# Patient Record
Sex: Female | Born: 1945
Health system: Southern US, Community
[De-identification: ages and names within clinical notes are randomized; demographics above are authoritative.]

## PROBLEM LIST (undated history)

## (undated) DIAGNOSIS — C50919 Malignant neoplasm of unspecified site of unspecified female breast: Secondary | ICD-10-CM

## (undated) DIAGNOSIS — I10 Essential (primary) hypertension: Secondary | ICD-10-CM

## (undated) DIAGNOSIS — C801 Malignant (primary) neoplasm, unspecified: Secondary | ICD-10-CM

---

## 1997-09-18 HISTORY — PX: MASTECTOMY: SHX3

## 1997-12-30 ENCOUNTER — Ambulatory Visit (HOSPITAL_COMMUNITY): Admission: RE | Admit: 1997-12-30 | Discharge: 1997-12-30 | Payer: Self-pay | Admitting: Hematology and Oncology

## 1998-01-04 ENCOUNTER — Ambulatory Visit (HOSPITAL_COMMUNITY): Admission: RE | Admit: 1998-01-04 | Discharge: 1998-01-04 | Payer: Self-pay | Admitting: Hematology and Oncology

## 1998-01-07 ENCOUNTER — Ambulatory Visit (HOSPITAL_BASED_OUTPATIENT_CLINIC_OR_DEPARTMENT_OTHER): Admission: RE | Admit: 1998-01-07 | Discharge: 1998-01-07 | Payer: Self-pay | Admitting: Surgery

## 1998-03-29 ENCOUNTER — Encounter: Admission: RE | Admit: 1998-03-29 | Discharge: 1998-06-27 | Payer: Self-pay | Admitting: *Deleted

## 1998-06-30 ENCOUNTER — Encounter: Admission: RE | Admit: 1998-06-30 | Discharge: 1998-09-28 | Payer: Self-pay | Admitting: Hematology and Oncology

## 1998-12-09 ENCOUNTER — Ambulatory Visit (HOSPITAL_BASED_OUTPATIENT_CLINIC_OR_DEPARTMENT_OTHER): Admission: RE | Admit: 1998-12-09 | Discharge: 1998-12-09 | Payer: Self-pay | Admitting: Plastic Surgery

## 1999-01-25 ENCOUNTER — Ambulatory Visit (HOSPITAL_BASED_OUTPATIENT_CLINIC_OR_DEPARTMENT_OTHER): Admission: RE | Admit: 1999-01-25 | Discharge: 1999-01-25 | Payer: Self-pay | Admitting: Plastic Surgery

## 1999-05-12 ENCOUNTER — Other Ambulatory Visit: Admission: RE | Admit: 1999-05-12 | Discharge: 1999-05-12 | Payer: Self-pay | Admitting: Hematology and Oncology

## 1999-05-26 ENCOUNTER — Encounter: Admission: RE | Admit: 1999-05-26 | Discharge: 1999-06-23 | Payer: Self-pay | Admitting: Hematology and Oncology

## 1999-07-29 ENCOUNTER — Encounter: Payer: Self-pay | Admitting: Hematology and Oncology

## 1999-07-29 ENCOUNTER — Encounter: Admission: RE | Admit: 1999-07-29 | Discharge: 1999-07-29 | Payer: Self-pay | Admitting: Hematology and Oncology

## 1999-11-02 ENCOUNTER — Ambulatory Visit (HOSPITAL_COMMUNITY): Admission: RE | Admit: 1999-11-02 | Discharge: 1999-11-02 | Payer: Self-pay | Admitting: Hematology & Oncology

## 1999-11-02 ENCOUNTER — Encounter: Payer: Self-pay | Admitting: Hematology & Oncology

## 2000-01-18 ENCOUNTER — Encounter: Payer: Self-pay | Admitting: Hematology & Oncology

## 2000-01-18 ENCOUNTER — Encounter: Admission: RE | Admit: 2000-01-18 | Discharge: 2000-01-18 | Payer: Self-pay | Admitting: Hematology & Oncology

## 2000-02-16 ENCOUNTER — Ambulatory Visit (HOSPITAL_COMMUNITY): Admission: RE | Admit: 2000-02-16 | Discharge: 2000-02-16 | Payer: Self-pay | Admitting: Hematology & Oncology

## 2000-02-16 ENCOUNTER — Encounter: Payer: Self-pay | Admitting: Hematology & Oncology

## 2000-02-22 ENCOUNTER — Ambulatory Visit (HOSPITAL_BASED_OUTPATIENT_CLINIC_OR_DEPARTMENT_OTHER): Admission: RE | Admit: 2000-02-22 | Discharge: 2000-02-22 | Payer: Self-pay | Admitting: Surgery

## 2000-07-27 ENCOUNTER — Encounter: Admission: RE | Admit: 2000-07-27 | Discharge: 2000-07-27 | Payer: Self-pay | Admitting: Gynecology

## 2000-07-27 ENCOUNTER — Encounter: Payer: Self-pay | Admitting: Gynecology

## 2000-08-29 ENCOUNTER — Other Ambulatory Visit: Admission: RE | Admit: 2000-08-29 | Discharge: 2000-08-29 | Payer: Self-pay | Admitting: Gynecology

## 2001-01-31 ENCOUNTER — Encounter: Admission: RE | Admit: 2001-01-31 | Discharge: 2001-01-31 | Payer: Self-pay | Admitting: Hematology & Oncology

## 2001-01-31 ENCOUNTER — Encounter: Payer: Self-pay | Admitting: Hematology & Oncology

## 2002-02-05 ENCOUNTER — Encounter: Admission: RE | Admit: 2002-02-05 | Discharge: 2002-02-05 | Payer: Self-pay | Admitting: *Deleted

## 2002-02-05 ENCOUNTER — Encounter: Payer: Self-pay | Admitting: *Deleted

## 2002-09-08 ENCOUNTER — Encounter: Admission: RE | Admit: 2002-09-08 | Discharge: 2002-09-08 | Payer: Self-pay | Admitting: Family Medicine

## 2002-09-08 ENCOUNTER — Encounter: Payer: Self-pay | Admitting: Family Medicine

## 2003-02-19 ENCOUNTER — Encounter: Payer: Self-pay | Admitting: Family Medicine

## 2003-02-19 ENCOUNTER — Encounter: Admission: RE | Admit: 2003-02-19 | Discharge: 2003-02-19 | Payer: Self-pay | Admitting: Family Medicine

## 2003-11-30 ENCOUNTER — Encounter: Admission: RE | Admit: 2003-11-30 | Discharge: 2003-11-30 | Payer: Self-pay | Admitting: Family Medicine

## 2004-02-24 ENCOUNTER — Encounter: Admission: RE | Admit: 2004-02-24 | Discharge: 2004-02-24 | Payer: Self-pay | Admitting: Hematology & Oncology

## 2004-10-13 ENCOUNTER — Ambulatory Visit: Payer: Self-pay | Admitting: Hematology & Oncology

## 2005-02-28 ENCOUNTER — Encounter: Admission: RE | Admit: 2005-02-28 | Discharge: 2005-02-28 | Payer: Self-pay | Admitting: Hematology & Oncology

## 2005-09-25 ENCOUNTER — Other Ambulatory Visit: Admission: RE | Admit: 2005-09-25 | Discharge: 2005-09-25 | Payer: Self-pay | Admitting: Obstetrics and Gynecology

## 2005-10-09 ENCOUNTER — Ambulatory Visit: Payer: Self-pay | Admitting: Hematology & Oncology

## 2006-03-01 ENCOUNTER — Encounter: Admission: RE | Admit: 2006-03-01 | Discharge: 2006-03-01 | Payer: Self-pay | Admitting: Hematology & Oncology

## 2006-10-03 ENCOUNTER — Ambulatory Visit: Payer: Self-pay | Admitting: Hematology & Oncology

## 2006-10-08 LAB — CBC WITH DIFFERENTIAL/PLATELET
BASO%: 1 % (ref 0.0–2.0)
Basophils Absolute: 0 10*3/uL (ref 0.0–0.1)
Eosinophils Absolute: 0 10*3/uL (ref 0.0–0.5)
HGB: 13.6 g/dL (ref 11.6–15.9)
MONO#: 0.2 10*3/uL (ref 0.1–0.9)
NEUT%: 43.7 % (ref 39.6–76.8)
WBC: 4.2 10*3/uL (ref 3.9–10.0)
lymph#: 2.1 10*3/uL (ref 0.9–3.3)

## 2006-10-08 LAB — COMPREHENSIVE METABOLIC PANEL
AST: 20 U/L (ref 0–37)
Albumin: 4.7 g/dL (ref 3.5–5.2)
Alkaline Phosphatase: 69 U/L (ref 39–117)
BUN: 19 mg/dL (ref 6–23)
CO2: 24 mEq/L (ref 19–32)
Creatinine, Ser: 0.75 mg/dL (ref 0.40–1.20)
Glucose, Bld: 90 mg/dL (ref 70–99)
Sodium: 140 mEq/L (ref 135–145)
Total Bilirubin: 0.5 mg/dL (ref 0.3–1.2)
Total Protein: 7.3 g/dL (ref 6.0–8.3)

## 2007-03-04 ENCOUNTER — Encounter: Admission: RE | Admit: 2007-03-04 | Discharge: 2007-03-04 | Payer: Self-pay | Admitting: Hematology & Oncology

## 2007-10-02 ENCOUNTER — Ambulatory Visit: Payer: Self-pay | Admitting: Hematology & Oncology

## 2007-10-07 LAB — LACTATE DEHYDROGENASE: LDH: 138 U/L (ref 94–250)

## 2007-10-07 LAB — COMPREHENSIVE METABOLIC PANEL
ALT: 16 U/L (ref 0–35)
Albumin: 4.6 g/dL (ref 3.5–5.2)
Glucose, Bld: 95 mg/dL (ref 70–99)
Potassium: 3.8 mEq/L (ref 3.5–5.3)
Sodium: 140 mEq/L (ref 135–145)
Total Protein: 7.3 g/dL (ref 6.0–8.3)

## 2007-10-07 LAB — CBC WITH DIFFERENTIAL/PLATELET
BASO%: 0.2 % (ref 0.0–2.0)
HGB: 13.9 g/dL (ref 11.6–15.9)
LYMPH%: 31 % (ref 14.0–48.0)
MCH: 33.5 pg (ref 26.0–34.0)
MCHC: 35.3 g/dL (ref 32.0–36.0)
MONO#: 0.2 10*3/uL (ref 0.1–0.9)
NEUT#: 3.9 10*3/uL (ref 1.5–6.5)

## 2008-03-05 ENCOUNTER — Encounter: Admission: RE | Admit: 2008-03-05 | Discharge: 2008-03-05 | Payer: Self-pay | Admitting: Hematology & Oncology

## 2008-10-09 ENCOUNTER — Ambulatory Visit: Payer: Self-pay | Admitting: Hematology & Oncology

## 2008-10-12 LAB — CBC WITH DIFFERENTIAL (CANCER CENTER ONLY)
BASO#: 0 10*3/uL (ref 0.0–0.2)
BASO%: 0.4 % (ref 0.0–2.0)
HCT: 37.7 % (ref 34.8–46.6)
MCH: 33.3 pg (ref 26.0–34.0)
MONO#: 0.2 10*3/uL (ref 0.1–0.9)
MONO%: 3.6 % (ref 0.0–13.0)
NEUT#: 2.5 10*3/uL (ref 1.5–6.5)
NEUT%: 54.4 % (ref 39.6–80.0)
Platelets: 211 10*3/uL (ref 145–400)
RBC: 3.98 10*6/uL (ref 3.70–5.32)
WBC: 4.5 10*3/uL (ref 3.9–10.0)

## 2008-10-12 LAB — COMPREHENSIVE METABOLIC PANEL
ALT: 23 U/L (ref 0–35)
Albumin: 4.5 g/dL (ref 3.5–5.2)
CO2: 22 mEq/L (ref 19–32)
Calcium: 9.5 mg/dL (ref 8.4–10.5)
Potassium: 3.5 mEq/L (ref 3.5–5.3)
Sodium: 141 mEq/L (ref 135–145)
Total Protein: 6.8 g/dL (ref 6.0–8.3)

## 2009-03-08 ENCOUNTER — Encounter: Admission: RE | Admit: 2009-03-08 | Discharge: 2009-03-08 | Payer: Self-pay | Admitting: Hematology & Oncology

## 2009-05-07 ENCOUNTER — Encounter (INDEPENDENT_AMBULATORY_CARE_PROVIDER_SITE_OTHER): Payer: Self-pay | Admitting: *Deleted

## 2009-09-30 ENCOUNTER — Encounter (INDEPENDENT_AMBULATORY_CARE_PROVIDER_SITE_OTHER): Payer: Self-pay | Admitting: *Deleted

## 2009-09-30 ENCOUNTER — Telehealth: Payer: Self-pay | Admitting: Internal Medicine

## 2009-10-08 ENCOUNTER — Ambulatory Visit: Payer: Self-pay | Admitting: Hematology & Oncology

## 2009-10-14 LAB — CBC WITH DIFFERENTIAL (CANCER CENTER ONLY)
BASO%: 0.5 % (ref 0.0–2.0)
EOS%: 1 % (ref 0.0–7.0)
Eosinophils Absolute: 0.1 10*3/uL (ref 0.0–0.5)
HCT: 40.4 % (ref 34.8–46.6)
HGB: 13.9 g/dL (ref 11.6–15.9)
LYMPH%: 42.7 % (ref 14.0–48.0)
MCH: 32.7 pg (ref 26.0–34.0)
MCV: 95 fL (ref 81–101)
NEUT%: 52.7 % (ref 39.6–80.0)
RBC: 4.24 10*6/uL (ref 3.70–5.32)
RDW: 10.4 % — ABNORMAL LOW (ref 10.5–14.6)

## 2009-10-14 LAB — COMPREHENSIVE METABOLIC PANEL
ALT: 19 U/L (ref 0–35)
BUN: 20 mg/dL (ref 6–23)
Calcium: 9.6 mg/dL (ref 8.4–10.5)
Creatinine, Ser: 0.77 mg/dL (ref 0.40–1.20)
Glucose, Bld: 122 mg/dL — ABNORMAL HIGH (ref 70–99)
Potassium: 3.5 mEq/L (ref 3.5–5.3)
Sodium: 140 mEq/L (ref 135–145)
Total Protein: 7.2 g/dL (ref 6.0–8.3)

## 2009-10-28 ENCOUNTER — Ambulatory Visit: Payer: Self-pay | Admitting: Diagnostic Radiology

## 2009-10-28 ENCOUNTER — Ambulatory Visit (HOSPITAL_BASED_OUTPATIENT_CLINIC_OR_DEPARTMENT_OTHER): Admission: RE | Admit: 2009-10-28 | Discharge: 2009-10-28 | Payer: Self-pay | Admitting: Hematology & Oncology

## 2010-03-10 ENCOUNTER — Encounter: Admission: RE | Admit: 2010-03-10 | Discharge: 2010-03-10 | Payer: Self-pay | Admitting: Hematology & Oncology

## 2010-09-27 ENCOUNTER — Encounter (INDEPENDENT_AMBULATORY_CARE_PROVIDER_SITE_OTHER): Payer: Self-pay | Admitting: *Deleted

## 2010-10-11 ENCOUNTER — Ambulatory Visit: Payer: Self-pay | Admitting: Hematology & Oncology

## 2010-10-13 LAB — CBC WITH DIFFERENTIAL (CANCER CENTER ONLY)
BASO#: 0 10*3/uL (ref 0.0–0.2)
EOS%: 1.1 % (ref 0.0–7.0)
Eosinophils Absolute: 0.1 10*3/uL (ref 0.0–0.5)
HCT: 39 % (ref 34.8–46.6)
HGB: 13.5 g/dL (ref 11.6–15.9)
LYMPH#: 2.1 10*3/uL (ref 0.9–3.3)
LYMPH%: 48.1 % — ABNORMAL HIGH (ref 14.0–48.0)
MCH: 32.8 pg (ref 26.0–34.0)
MONO#: 0.2 10*3/uL (ref 0.1–0.9)
NEUT%: 45.1 % (ref 39.6–80.0)
RBC: 4.11 10*6/uL (ref 3.70–5.32)
RDW: 10.7 % (ref 10.5–14.6)
WBC: 4.4 10*3/uL (ref 3.9–10.0)

## 2010-10-14 LAB — COMPREHENSIVE METABOLIC PANEL
Alkaline Phosphatase: 51 U/L (ref 39–117)
BUN: 24 mg/dL — ABNORMAL HIGH (ref 6–23)
CO2: 26 mEq/L (ref 19–32)
Calcium: 9.8 mg/dL (ref 8.4–10.5)
Glucose, Bld: 88 mg/dL (ref 70–99)
Sodium: 139 mEq/L (ref 135–145)
Total Bilirubin: 0.5 mg/dL (ref 0.3–1.2)

## 2010-10-20 NOTE — Progress Notes (Signed)
Summary: Schedule recall colon  Phone Note Outgoing Call Call back at [facility] Phone [phone]   Call placed by: Christie Nottingham CMA Duncan Dull),  September 30, 2009 3:56 PM Call placed to: Patient Summary of Call: Called pt to schedule recall colonoscopy. Pt scheduled the procedure for 11-09-09. Letter mailed for pt.  Initial call taken by: Christie Nottingham CMA Duncan Dull),  September 30, 2009 3:57 PM

## 2010-10-20 NOTE — Letter (Signed)
Summary: Previsit letter  [facility]  [address] [place], [place] [address]   Phone: [phone]  Fax: [phone]       09/30/2009 MRN: [id number]  NATALIYAH PACKHAM [address] [place], [place]  [address]  [place]  Dear Ms. Billups,  Welcome to the Gastroenterology Division at [facility]    You are scheduled to see a nurse for your pre-procedure visit on  11/05/09  at  1:00pm on the 3rd floor at [facility], [address]  We ask that you try to arrive at our office 15 minutes prior to your appointment time to allow for check-in.  Your nurse visit will consist of discussing your medical and surgical history, your immediate family medical history, and your medications.    Please bring a complete list of all your medications or, if you prefer, bring the medication bottles and we will list them.  We will need to be aware of both prescribed and over the counter drugs.  We will need to know exact dosage information as well.  If you are on blood thinners (Coumadin, Plavix, Aggrenox, Ticlid, etc.) please call our office today/prior to your appointment, as we need to consult with your physician about holding your medication.   Please be prepared to read and sign documents such as consent forms, a financial agreement, and acknowledgement forms.  If necessary, and with your consent, a friend or relative is welcome to sit-in on the nurse visit with you.  Please bring your insurance card so that we may make a copy of it.  If your insurance requires a referral to see a specialist, please bring your referral form from your primary care physician.  No co-pay is required for this nurse visit.     If you cannot keep your appointment, please call [phone] to cancel or reschedule prior to your appointment date.  This allows [place] the opportunity to schedule an appointment for another patient in need of care.    Thank you for choosing [facility] for your medical needs.  We  appreciate the opportunity to care for you.  Please visit [place] at our website  to learn more about our practice.                     Sincerely.                                                                                                                   The Gastroenterology Division

## 2010-10-20 NOTE — Letter (Signed)
Summary: Pre Visit Letter Revised  [facility]  [address] [place], [place] [address]   Phone: [phone]  Fax: [phone]        09/27/2010 MRN: [id number] Sophia Hernandez [address] [place], [place]  [address]  [place]             Procedure Date:  11/08/2010 @ 11:00   Recall colon--Dr. Juanda Chance   Welcome to the Gastroenterology Division at [facility].    You are scheduled to see a nurse for your pre-procedure visit on 10/25/2010 at 1:00 on the 3rd floor at [facility], [address].  We ask that you try to arrive at our office 15 minutes prior to your appointment time to allow for check-in.  Please take a minute to review the attached form.  If you answer "Yes" to one or more of the questions on the first page, we ask that you call the person listed at your earliest opportunity.  If you answer "No" to all of the questions, please complete the rest of the form and bring it to your appointment.    Your nurse visit will consist of discussing your medical and surgical history, your immediate family medical history, and your medications.   If you are unable to list all of your medications on the form, please bring the medication bottles to your appointment and we will list them.  We will need to be aware of both prescribed and over the counter drugs.  We will need to know exact dosage information as well.    Please be prepared to read and sign documents such as consent forms, a financial agreement, and acknowledgement forms.  If necessary, and with your consent, a friend or relative is welcome to sit-in on the nurse visit with you.  Please bring your insurance card so that we may make a copy of it.  If your insurance requires a referral to see a specialist, please bring your referral form from your primary care physician.  No co-pay is required for this nurse visit.     If you cannot keep your appointment, please call [phone] to cancel or reschedule prior to  your appointment date.  This allows [place] the opportunity to schedule an appointment for another patient in need of care.    Thank you for choosing [facility] for your medical needs.  We appreciate the opportunity to care for you.  Please visit [place] at our website  to learn more about our practice.  Sincerely, The Gastroenterology Division

## 2010-10-24 ENCOUNTER — Encounter (INDEPENDENT_AMBULATORY_CARE_PROVIDER_SITE_OTHER): Payer: Self-pay

## 2010-10-25 ENCOUNTER — Encounter: Payer: Self-pay | Admitting: Internal Medicine

## 2010-11-03 NOTE — Letter (Signed)
Summary: [facility] Instructions  [facility]  [address] [place], [place] [address]   Phone: [phone]  Fax: [phone]       AJANAY FARVE    05/11/1946    MRN: [id number]       Procedure Day /Date:  Tuesday 11/08/2010     Arrival Time: 10:00 am     Procedure Time: 11:00 am     Location of Procedure:                    _ x_  [facility] (4th Floor)    PREPARATION FOR COLONOSCOPY WITH MIRALAX  Starting 5 days prior to your procedure Thursday 2/16 do not eat nuts, seeds, popcorn, corn, beans, peas,  salads, or any raw vegetables.  Do not take any fiber supplements (e.g. Metamucil, Citrucel, and Benefiber). ____________________________________________________________________________________________________   THE DAY BEFORE YOUR PROCEDURE         DATE: Monday 2/20  1   Drink clear liquids the entire day-NO SOLID FOOD  2   Do not drink anything colored red or purple.  Avoid juices with pulp.  No orange juice.  3   Drink at least 64 oz. (8 glasses) of fluid/clear liquids during the day to prevent dehydration and help the prep work efficiently.  CLEAR LIQUIDS INCLUDE: Water Jello Ice Popsicles Tea (sugar ok, no milk/cream) Powdered fruit flavored drinks Coffee (sugar ok, no milk/cream) Gatorade Juice: apple, white grape, white cranberry  Lemonade Clear bullion, consomm, broth Carbonated beverages (any kind) Strained chicken noodle soup Hard Candy  4   Mix the entire bottle of Miralax with 64 oz. of Gatorade/Powerade in the morning and put in the refrigerator to chill.  5   At 3:00 pm take 2 Dulcolax/Bisacodyl tablets.  6   At 4:30 pm take one Reglan/Metoclopramide tablet.  7  Starting at 5:00 pm drink one 8 oz glass of the Miralax mixture every 15-20 minutes until you have finished drinking the entire 64 oz.  You should finish drinking prep around 7:30 or 8:00 pm.  8   If you are nauseated, you may take the 2nd Reglan/Metoclopramide  tablet at 6:30 pm.        9    At 8:00 pm take 2 more DULCOLAX/Bisacodyl tablets.     THE DAY OF YOUR PROCEDURE      DATE: Tuesday 2/21  You may drink clear liquids until 9:00 am  (2 HOURS BEFORE PROCEDURE).   MEDICATION INSTRUCTIONS  Unless otherwise instructed, you should take regular prescription medications with a small sip of water as early as possible the morning of your procedure.  Additional medication instructions: Do not take Losartan/HCTZ am of procedure.         OTHER INSTRUCTIONS  You will need a responsible adult at least 67 years of age to accompany you and drive you home.   This person must remain in the waiting room during your procedure.  Wear loose fitting clothing that is easily removed.  Leave jewelry and other valuables at home.  However, you may wish to bring a book to read or an iPod/MP3 player to listen to music as you wait for your procedure to start.  Remove all body piercing jewelry and leave at home.  Total time from sign-in until discharge is approximately 2-3 hours.  You should go home directly after your procedure and rest.  You can resume normal activities the day after your procedure.  The day of your  procedure you should not:   Drive   Make legal decisions   Operate machinery   Drink alcohol   Return to work  You will receive specific instructions about eating, activities and medications before you leave.   The above instructions have been reviewed and explained to me by   Ulis Rias RN  October 25, 2010 1:28 PM     I fully understand and can verbalize these instructions _____________________________ Date _______

## 2010-11-03 NOTE — Miscellaneous (Signed)
Summary: RECALL COL.Lennon Alstrom  [facility]  W PT [employer] NO BTS NO DIAB MEDLIST  .Marland KitchenMarland Kitchen  Clinical Lists Changes  Medications: Added new medication of MIRALAX   POWD (POLYETHYLENE GLYCOL 3350) As per prep  instructions. - Signed Added new medication of REGLAN 10 MG  TABS (METOCLOPRAMIDE HCL) As per prep instructions. - Signed Added new medication of DULCOLAX 5 MG  TBEC (BISACODYL) Day before procedure take 2 at 3pm and 2 at 8pm. - Signed Rx of MIRALAX   POWD (POLYETHYLENE GLYCOL 3350) As per prep  instructions.;  #255gm x 0;  Signed;  Entered by: Ulis Rias RN;  Authorized by: Hart Carwin MD;  Method used: Electronically to [facility]  [address] [phone]*, [address] [place], [place], [place]  [address], Ph: [phone] or [phone], Fax: [phone] Rx of REGLAN 10 MG  TABS (METOCLOPRAMIDE HCL) As per prep instructions.;  #2 x 0;  Signed;  Entered by: Ulis Rias RN;  Authorized by: Hart Carwin MD;  Method used: Electronically to [facility]  [address] [phone]*, [address] [place], [place], [place]  [address], Ph: [phone] or [phone], Fax: [phone] Rx of DULCOLAX 5 MG  TBEC (BISACODYL) Day before procedure take 2 at 3pm and 2 at 8pm.;  #4 x 0;  Signed;  Entered by: Ulis Rias RN;  Authorized by: Hart Carwin MD;  Method used: Electronically to [facility]  [address] [phone]*, [address] [place], [place], [place]  [address], Ph: [phone] or [phone], Fax: [phone] Allergies: Added new allergy or adverse reaction of * SULFUR DRUGS Added new allergy or adverse reaction of PENICILLIN Added new allergy or adverse reaction of * CONTRAST DYE Observations: Added new observation of NKA: F (10/25/2010 13:04)    Prescriptions: DULCOLAX 5 MG  TBEC (BISACODYL) Day before procedure take 2 at 3pm and 2 at 8pm.  #4 x 0   Entered by:   Ulis Rias RN   Authorized by:   Hart Carwin MD   Signed by:   Ulis Rias RN on 10/25/2010   Method used:   Electronically to        [facility]  [address] [phone]*  (retail)       [address]       [place], [place]  [address]       Ph: [phone] or [phone]       Fax: [phone]   RxID:   [id number] REGLAN 10 MG  TABS (METOCLOPRAMIDE HCL) As per prep instructions.  #2 x 0   Entered by:   Ulis Rias RN   Authorized by:   Hart Carwin MD   Signed by:   Ulis Rias RN on 10/25/2010   Method used:   [facility]ronically to        [facility]  [address] [phone]* (retail)       [address]       [place], [place]  [address]       Ph: [phone] or [phone]       Fax: [phone]   RxID:   [id number] MIRALAX   POWD (POLYETHYLENE GLYCOL 3350) As per prep  instructions.  #255gm x 0   Entered by:   Ulis Rias RN   Authorized by:   Hart Carwin MD   Signed by:   Ulis Rias RN on 10/25/2010   Method  used:   [facility]ronically to        [facility]  [address] [phone]* (retail)       [address]       [place], [place]  [address]       Ph: [phone] or [phone]       Fax: [phone]   RxID:   [id number]

## 2010-11-07 ENCOUNTER — Telehealth: Payer: Self-pay | Admitting: Internal Medicine

## 2010-11-08 ENCOUNTER — Other Ambulatory Visit: Payer: Self-pay | Admitting: Internal Medicine

## 2010-11-15 NOTE — Progress Notes (Signed)
Summary: Resch'd COL  Phone Note Call from Patient   Caller: Patient Call For: Dr. Juanda Chance Reason for Call: Talk to Nurse Summary of Call: pt. canceled and r/s her COL from tomorrow until 11-17-10 b/c she received a call last night. There is a death in her family out of [place]. Would you like pt. charged the cancelation fee? Initial call taken by: Karna Christmas,  November 07, 2010 8:19 AM  Follow-up for Phone Call        no charge, Follow-up by: Hart Carwin MD,  November 07, 2010 8:20 AM

## 2010-11-17 ENCOUNTER — Other Ambulatory Visit (AMBULATORY_SURGERY_CENTER): Payer: Medicare Other | Admitting: Internal Medicine

## 2010-11-17 ENCOUNTER — Other Ambulatory Visit: Payer: Self-pay | Admitting: Internal Medicine

## 2010-11-17 DIAGNOSIS — D126 Benign neoplasm of colon, unspecified: Secondary | ICD-10-CM

## 2010-11-17 DIAGNOSIS — Z1211 Encounter for screening for malignant neoplasm of colon: Secondary | ICD-10-CM

## 2010-11-22 ENCOUNTER — Encounter: Payer: Self-pay | Admitting: Internal Medicine

## 2010-11-24 NOTE — Procedures (Addendum)
Summary: Colonoscopy  Patient: Sophia Hernandez Note: All result statuses are Final unless otherwise noted.  Tests: (1) Colonoscopy (COL)   COL Colonoscopy           DONE     [facility]     [address]     [place], [place]  [address]          COLONOSCOPY PROCEDURE REPORT          PATIENT:  Sophia Hernandez, Sophia Hernandez  MR#:  [id number]     BIRTHDATE:  1946-05-01, 64 yrs. old  GENDER:  female     ENDOSCOPIST:  Hedwig Morton. Juanda Chance, MD     REF. BY:  Juline Patch, M.D.     PROCEDURE DATE:  11/17/2010     PROCEDURE:  Colonoscopy [address]     ASA CLASS:  Class II     INDICATIONS:  Routine Risk Screening last colon 2000 was normal     MEDICATIONS:   Versed 8 mg, Fentanyl 75 mcg          DESCRIPTION OF PROCEDURE:   After the risks benefits and     alternatives of the procedure were thoroughly explained, informed     consent was obtained.  Digital rectal exam was performed and     revealed no rectal masses.   The LB CF-[id number] endoscope     was introduced through the anus and advanced to the cecum, which     was identified by both the appendix and ileocecal valve, without     limitations.  The quality of the prep was good, using MiraLax.     The instrument was then slowly withdrawn as the colon was fully     examined.     <<PROCEDUREIMAGES>>          FINDINGS:  A diminutive polyp was found. at 55 cm, 2 mm polyp The     polyp was removed using cold biopsy forceps (see image4).  This     was otherwise a normal examination of the colon (see image5,     image3, image2, and image1).   Retroflexed views in the rectum     revealed no abnormalities.    The scope was then withdrawn from     the patient and the procedure completed.          COMPLICATIONS:  None     ENDOSCOPIC IMPRESSION:     1) Diminutive polyp     2) Otherwise normal examination     RECOMMENDATIONS:     1) Await pathology results     2) High fiber diet.     REPEAT EXAM:  No          ______________________________  Hedwig Morton. Juanda Chance, MD          CC:          n.     eSIGNED:   Hedwig Morton. Brodie at 11/17/2010 03:25 PM          Elon Spanner, [id number]  Note: An exclamation mark (!) indicates a result that was not dispersed into the flowsheet. Document Creation Date: 11/17/2010 3:26 PM _______________________________________________________________________  (1) Order result status: Final Collection or observation date-time: 11/17/2010 15:20 Requested date-time:  Receipt date-time:  Reported date-time:  Referring Physician:   Ordering Physician: Lina Sar [phone]) Specimen Source:  Source: Launa Grill Order Number: [id number] Lab site:   Appended Document: Colonoscopy recall colon 10 years  Appended Document: Colonoscopy     Procedures  Next Due Date:    Colonoscopy: 11/2020

## 2010-11-29 NOTE — Letter (Signed)
Summary: Patient Notice- Polyp Results  [facility]  [address] [place], [place] [address]   Phone: [phone]  Fax: [phone]        November 22, 2010 MRN: [id number]    Sophia Hernandez [address] [place], [place]  [address]    Dear [name],  I am pleased to inform you that the colon polyp(s) removed during your recent colonoscopy was (were) found to be benign (no cancer detected) upon pathologic examination.The polyp consisted of normal tissue  I recommend you have a repeat colonoscopy examination in 10_ years to look for recurrent polyps, as having colon polyps increases your risk for having recurrent polyps or even colon cancer in the future.  Should you develop new or worsening symptoms of abdominal pain, bowel habit changes or bleeding from the rectum or bowels, please schedule an evaluation with either your primary care physician or with me.  Additional information/recommendations:  _x_ No further action with gastroenterology is needed at this time. Please      follow-up with your primary care physician for your other healthcare      needs.  __ Please call [phone] to schedule a return visit to review your      situation.  __ Please keep your follow-up visit as already scheduled.  __ Continue treatment plan as outlined the day of your exam.  Please call us if you are having persistent problems or have questions about your condition that have not been fully answered at this time.  Sincerely,  Hart Carwin MD  This letter has been electronically signed by your physician.  Appended Document: Patient Notice- Polyp Results letter mailed

## 2011-02-01 ENCOUNTER — Other Ambulatory Visit: Payer: Self-pay | Admitting: Hematology & Oncology

## 2011-02-01 DIAGNOSIS — Z1231 Encounter for screening mammogram for malignant neoplasm of breast: Secondary | ICD-10-CM

## 2011-02-03 NOTE — Op Note (Signed)
[place]  Patient:    Sophia Hernandez, Sophia Hernandez                        MRN: [id number] Proc. Date: 02/22/00 Adm. Date:  [id number] Disc. Date: [id number] Attending:  Charlton Haws                           Operative Report  OFFICE MEDICAL RECORD NUMBER:  [id number]  PREOPERATIVE DIAGNOSIS:  ______ Port-A-Cath, status post treatment for breast cancer.  POSTOPERATIVE DIAGNOSIS:  ______ Port-A-Cath, status post treatment for breast cancer.  OPERATION:  Removal of Port-A-Cath.  SURGEON:  Currie Paris, M.D.  ANESTHESIA:  MAC.  CLINICAL HISTORY:  This patient has completed her treatments and desired removal of Port-A-Cath.  She wished to have it done under IV sedation rather than straight local.  DESCRIPTION OF PROCEDURE:  Patient brought to the operating room and given IV sedation.  The area was prepped and draped.  A combination of 1% Xylocaine plus 0.5% Marcaine with epinephrine was mixed equally and used for local.  The area was infiltrated and the incision made.  Port-A-Cath tubing was identified and the capsule around it opened so that I could see it nicely and backed up a little bit.  A purse-string was placed around the tube tract and the tubing then backed out of its tract and came out intact.  The purse-string suture was tied down.  The capsule around the Port-A-Cath itself was opened and the holding sutures of Prolene cut and the Port-A-Cath manipulated out.  I used the cautery to cauterize the capsule a bit and then closed the wound in layers with 3-0 Vicryl, followed by 4-0 Monocryl subcuticular plus Steri-Strips.  The patient tolerated the procedure well.  There are no operative complications. All counts were correct. DD:  02/22/00 TD:  02/25/00 Job: [id number]

## 2011-03-13 ENCOUNTER — Ambulatory Visit
Admission: RE | Admit: 2011-03-13 | Discharge: 2011-03-13 | Disposition: A | Discharge status: Home / Self Care | Payer: Medicare Other | Source: Ambulatory Visit | Attending: Hematology & Oncology | Admitting: Hematology & Oncology

## 2011-03-13 DIAGNOSIS — Z1231 Encounter for screening mammogram for malignant neoplasm of breast: Secondary | ICD-10-CM

## 2011-10-11 ENCOUNTER — Telehealth: Payer: Self-pay | Admitting: Hematology & Oncology

## 2011-10-11 NOTE — Telephone Encounter (Signed)
Pt moved 1-24 to 1-28 she is sick

## 2011-10-12 ENCOUNTER — Other Ambulatory Visit: Payer: Medicare Other | Admitting: Lab

## 2011-10-12 ENCOUNTER — Ambulatory Visit: Payer: Medicare Other | Admitting: Hematology & Oncology

## 2011-10-16 ENCOUNTER — Ambulatory Visit (HOSPITAL_BASED_OUTPATIENT_CLINIC_OR_DEPARTMENT_OTHER): Payer: Medicare Other | Admitting: Hematology & Oncology

## 2011-10-16 ENCOUNTER — Other Ambulatory Visit (HOSPITAL_BASED_OUTPATIENT_CLINIC_OR_DEPARTMENT_OTHER): Payer: Medicare Other | Admitting: Lab

## 2011-10-16 VITALS — BP 137/88 | HR 84 | Temp 97.1°F | Ht 60.0 in | Wt 138.0 lb

## 2011-10-16 DIAGNOSIS — C50919 Malignant neoplasm of unspecified site of unspecified female breast: Secondary | ICD-10-CM

## 2011-10-16 DIAGNOSIS — E559 Vitamin D deficiency, unspecified: Secondary | ICD-10-CM

## 2011-10-16 DIAGNOSIS — Z85118 Personal history of other malignant neoplasm of bronchus and lung: Secondary | ICD-10-CM

## 2011-10-16 DIAGNOSIS — Z853 Personal history of malignant neoplasm of breast: Secondary | ICD-10-CM

## 2011-10-16 NOTE — Progress Notes (Signed)
This office note has been dictated.

## 2011-10-17 NOTE — Progress Notes (Signed)
CC:   Sophia Hernandez, M.D. Sophia Hernandez, M.D.  DIAGNOSIS: 1. Stage III (T3 N2 M0) infiltrating ductal carcinoma right breast,     clinical remission x15 years. 2. Stage I (T1 N0 M0) non-small cell lung cancer, right lung, clinical     remission x23 years.  INTERIM HISTORY:  Sophia Hernandez comes in for followup.  She is doing well. She had no problems last year.  She and her husband went on a cruise, went down to the [place], had a good time.  She has had no issues with fevers, sweats or chills.  She does have occasional lymphedema in the right arm.  This seems to come and go, but does not bother her all that much.  There have been no problems with bowels or bladder.  She has had no headaches.  She has had no nausea or vomiting.  Her last mammogram was done back in June 2012.  The mammogram found no evidence of malignancy.  Her last bone density test that I have on her was back in 2005.  PHYSICAL EXAMINATION:  This is a well-developed well-nourished white female in no obvious distress.  Vital signs: Temperature 97.1, pulse 84, respiratory rate 16, blood pressure 137/88.  Weight is 138.  Head and neck:  Exam shows a normocephalic, atraumatic skull.  There are no ocular or oral lesions.  There are no palpable cervical or supraclavicular lymph nodes.  Lungs:  Clear to percussion and auscultation bilaterally.  Cardiac: Regular rate and rhythm with normal S1 and S2.  There are no murmurs, rubs or bruits.  Abdomen:  Soft with good bowel sounds.  There is no fluid wave.  There is no palpable hepatosplenomegaly.  Back:  Thoracotomy scar in the right lateral chest wall.  This is well healed.  There is no tenderness over the thoracotomy site.  There is no tenderness over the spine, ribs, or hips.  Breasts: Exam shows left breast with no masses, edema or erythema.  There is no left axillary adenopathy.  Right chest wall shows well-healed mastectomy.  She has no chest wall nodules.   Neurological exam shows no focal neurological deficits.  Extremities:  No lymphedema in the right arm.  She has good range of motion of her joints.  LAB STUDIES:  Not done this visit.  IMPRESSION AND PLAN:  Sophia Hernandez is a 66 year old white female with a remote history of 2 separate malignancies.  She is 15 years out now from a breast cancer.  She has 23 years out now from a lung cancer.  I do not see any evidence of recurrence.  Will plan to get her back in 1 year as per usual.  I will check some lab work on her when we do see her back.   ______________________________ Josph Macho, M.D. PRE/MEDQ  D:  10/16/2011  T:  10/17/2011  Job:  [id number]

## 2011-10-19 ENCOUNTER — Encounter (INDEPENDENT_AMBULATORY_CARE_PROVIDER_SITE_OTHER): Payer: Self-pay | Admitting: Surgery

## 2011-10-19 DIAGNOSIS — Z853 Personal history of malignant neoplasm of breast: Secondary | ICD-10-CM | POA: Insufficient documentation

## 2012-02-09 ENCOUNTER — Other Ambulatory Visit: Payer: Self-pay | Admitting: Hematology & Oncology

## 2012-02-09 DIAGNOSIS — Z1231 Encounter for screening mammogram for malignant neoplasm of breast: Secondary | ICD-10-CM

## 2012-02-09 DIAGNOSIS — Z9011 Acquired absence of right breast and nipple: Secondary | ICD-10-CM

## 2012-03-13 ENCOUNTER — Ambulatory Visit
Admission: RE | Admit: 2012-03-13 | Discharge: 2012-03-13 | Disposition: A | Discharge status: Home / Self Care | Payer: Medicare Other | Source: Ambulatory Visit | Attending: Hematology & Oncology | Admitting: Hematology & Oncology

## 2012-03-13 DIAGNOSIS — Z9011 Acquired absence of right breast and nipple: Secondary | ICD-10-CM

## 2012-03-13 DIAGNOSIS — Z1231 Encounter for screening mammogram for malignant neoplasm of breast: Secondary | ICD-10-CM

## 2012-10-14 ENCOUNTER — Other Ambulatory Visit (HOSPITAL_BASED_OUTPATIENT_CLINIC_OR_DEPARTMENT_OTHER): Payer: Medicare Other | Admitting: Lab

## 2012-10-14 ENCOUNTER — Ambulatory Visit (HOSPITAL_BASED_OUTPATIENT_CLINIC_OR_DEPARTMENT_OTHER): Payer: Medicare Other | Admitting: Medical

## 2012-10-14 VITALS — BP 130/87 | HR 70 | Temp 97.8°F | Resp 16 | Ht 60.0 in | Wt 130.0 lb

## 2012-10-14 DIAGNOSIS — Z853 Personal history of malignant neoplasm of breast: Secondary | ICD-10-CM

## 2012-10-14 DIAGNOSIS — Z85118 Personal history of other malignant neoplasm of bronchus and lung: Secondary | ICD-10-CM

## 2012-10-14 DIAGNOSIS — I890 Lymphedema, not elsewhere classified: Secondary | ICD-10-CM

## 2012-10-14 DIAGNOSIS — C50919 Malignant neoplasm of unspecified site of unspecified female breast: Secondary | ICD-10-CM

## 2012-10-14 LAB — COMPREHENSIVE METABOLIC PANEL
ALT: 16 U/L (ref 0–35)
AST: 18 U/L (ref 0–37)
Albumin: 4.4 g/dL (ref 3.5–5.2)
Alkaline Phosphatase: 59 U/L (ref 39–117)
BUN: 22 mg/dL (ref 6–23)
Calcium: 9.6 mg/dL (ref 8.4–10.5)
Chloride: 105 mEq/L (ref 96–112)
Potassium: 3.8 mEq/L (ref 3.5–5.3)
Sodium: 141 mEq/L (ref 135–145)

## 2012-10-14 LAB — CBC WITH DIFFERENTIAL (CANCER CENTER ONLY)
BASO#: 0 10*3/uL (ref 0.0–0.2)
BASO%: 0.2 % (ref 0.0–2.0)
EOS%: 0.9 % (ref 0.0–7.0)
HGB: 12.3 g/dL (ref 11.6–15.9)
LYMPH#: 1.9 10*3/uL (ref 0.9–3.3)
MCH: 32.3 pg (ref 26.0–34.0)
MCHC: 34.1 g/dL (ref 32.0–36.0)
MONO%: 6.2 % (ref 0.0–13.0)
NEUT#: 2.3 10*3/uL (ref 1.5–6.5)
Platelets: 204 10*3/uL (ref 145–400)
RDW: 12 % (ref 11.1–15.7)

## 2012-10-14 NOTE — Progress Notes (Signed)
Diagnoses: #1 stage III (T3, N2, M0) infiltrating ductal carcinoma of the right breast, clinical remission x15 years.  #2.  Stage I (T1, N0, M0) non-small cell lung cancer, right lung, clinical remission x23 years.  Current therapy: Observation.  Interim history: Sophia Hernandez presents today for an office followup visit.  Overall she, reports, that she's doing quite well.  She's not had any new problems in the last year.  Her left, digital screening mammogram was back in June, and there is no evidence of any malignancy.  She, reports, that she's been watching what she eats and exercises.  She really does have a good time enjoying life.  She, reports, that she has an excellent appetite.  She denies any nausea, vomiting, diarrhea, constipation, any chest pain, shortness of breath, or cough.  She denies any fevers, chills, or night sweats.  She denies any lower leg swelling.  She denies any headaches, vision, changes, or rashes.  She denies any obvious, or abnormal bleeding.  Of note, she does have some occasional lymphedema in the right arm.  She does have a lymphedema sleeve to wear when she needs it.  The last, bone density test that we have on her was back in 2005.  Review of Systems: Constitutional:Negative for malaise/fatigue, fever, chills, weight loss, diaphoresis, activity change, appetite change, and unexpected weight change.  HEENT: Negative for double vision, blurred vision, visual loss, ear pain, tinnitus, congestion, rhinorrhea, epistaxis sore throat or sinus disease, oral pain/lesion, tongue soreness Respiratory: Negative for cough, chest tightness, shortness of breath, wheezing and stridor.  Cardiovascular: Negative for chest pain, palpitations, leg swelling, orthopnea, PND, DOE or claudication Gastrointestinal: Negative for nausea, vomiting, abdominal pain, diarrhea, constipation, blood in stool, melena, hematochezia, abdominal distention, anal bleeding, rectal pain, anorexia and hematemesis.   Genitourinary: Negative for dysuria, frequency, hematuria,  Musculoskeletal: Negative for myalgias, back pain, joint swelling, arthralgias and gait problem.  Skin: Negative for rash, color change, pallor and wound.  Neurological:. Negative for dizziness/light-headedness, tremors, seizures, syncope, facial asymmetry, speech difficulty, weakness, numbness, headaches and paresthesias.  Hematological: Negative for adenopathy. Does not bruise/bleed easily.  Psychiatric/Behavioral:  Negative for depression, no loss of interest in normal activity or change in sleep pattern.   Physical Exam: This is a pleasant, 67 year old, well-developed, well-nourished, white female, in no obvious distress Vitals: Temperature 97.8 degrees, pulse 70, respirations 16, blood pressure 130/87, weight 130 pounds HEENT reveals a normocephalic, atraumatic skull, no scleral icterus, no oral lesions  Neck is supple without any cervical or supraclavicular adenopathy.  Lungs are clear to auscultation bilaterally. There are no wheezes, rales or rhonci Cardiac is regular rate and rhythm with a normal S1 and S2. There are no murmurs, rubs, or bruits.  Abdomen is soft with good bowel sounds, there is no palpable mass. There is no palpable hepatosplenomegaly. There is no palpable fluid wave.  Musculoskeletal no tenderness of the spine, ribs, or hips.  Extremities there are no clubbing, cyanosis, or edema.  Skin no petechia, purpura or ecchymosis Neurologic is nonfocal. Breast: Left breast with no masses, edema, or erythema.  There is no left axillary adenopathy.  Right chest wall shows a well-healed mastectomy scar. Back: There is a thoracotomy scar in the right lateral chest wall.  This is well healed.  Laboratory Data: White count 4.5, hemoglobin 12.3, hematocrit 36.1, platelets 204,000  Current Outpatient Prescriptions on File Prior to Visit  Medication Sig Dispense Refill  . KLOR-CON M20 20 MEQ tablet       .  simvastatin  (ZOCOR) 20 MG tablet        Assessment/Plan: This is a pleasant, 67 year old, white female, with the following issues:  #1.  History of stage III infiltrating ductal carcinoma of the right breast.  She's been in clinical remission now for 15 years.  She continues with yearly left digital mammogram.  There is no sign of any type of recurrence on today's exam.  #2.  History of stage I non-small cell lung cancer.  This was of the right lung.  She's been in clinical remission going on 23 years.  I do not see any evidence of recurrence.  #3.  Followup.  Sophia Hernandez will follow back up with [place] in one year, but before then should there be questions or concerns.

## 2012-10-25 ENCOUNTER — Telehealth: Payer: Self-pay | Admitting: Oncology

## 2012-10-25 NOTE — Telephone Encounter (Addendum)
Message copied by Lacie Draft on Fri Oct 25, 2012  4:51 PM ------      Message from: Arlan Organ R      Created: Thu Oct 24, 2012  5:48 PM       Call - labs are ok! Pete  Patient notified.

## 2013-02-24 ENCOUNTER — Other Ambulatory Visit: Payer: Self-pay

## 2013-02-24 DIAGNOSIS — Z9011 Acquired absence of right breast and nipple: Secondary | ICD-10-CM

## 2013-02-24 DIAGNOSIS — Z1231 Encounter for screening mammogram for malignant neoplasm of breast: Secondary | ICD-10-CM

## 2013-04-01 ENCOUNTER — Ambulatory Visit
Admission: RE | Admit: 2013-04-01 | Discharge: 2013-04-01 | Disposition: A | Discharge status: Home / Self Care | Payer: Medicare Other | Source: Ambulatory Visit

## 2013-04-01 DIAGNOSIS — Z9011 Acquired absence of right breast and nipple: Secondary | ICD-10-CM

## 2013-04-01 DIAGNOSIS — Z1231 Encounter for screening mammogram for malignant neoplasm of breast: Secondary | ICD-10-CM

## 2013-10-13 ENCOUNTER — Other Ambulatory Visit (HOSPITAL_BASED_OUTPATIENT_CLINIC_OR_DEPARTMENT_OTHER): Payer: Medicare Other | Admitting: Lab

## 2013-10-13 ENCOUNTER — Encounter: Payer: Self-pay | Admitting: Hematology & Oncology

## 2013-10-13 ENCOUNTER — Ambulatory Visit (HOSPITAL_BASED_OUTPATIENT_CLINIC_OR_DEPARTMENT_OTHER): Payer: Medicare Other | Admitting: Hematology & Oncology

## 2013-10-13 VITALS — BP 153/65 | HR 77 | Temp 97.7°F | Resp 14 | Ht 63.0 in | Wt 128.0 lb

## 2013-10-13 DIAGNOSIS — Z85118 Personal history of other malignant neoplasm of bronchus and lung: Secondary | ICD-10-CM

## 2013-10-13 DIAGNOSIS — Z853 Personal history of malignant neoplasm of breast: Secondary | ICD-10-CM

## 2013-10-13 LAB — CBC WITH DIFFERENTIAL (CANCER CENTER ONLY)
BASO#: 0 10*3/uL (ref 0.0–0.2)
BASO%: 0.2 % (ref 0.0–2.0)
EOS ABS: 0 10*3/uL (ref 0.0–0.5)
EOS%: 0.4 % (ref 0.0–7.0)
HEMATOCRIT: 37.5 % (ref 34.8–46.6)
HGB: 12.5 g/dL (ref 11.6–15.9)
LYMPH#: 1.8 10*3/uL (ref 0.9–3.3)
LYMPH%: 39.9 % (ref 14.0–48.0)
MCH: 32.3 pg (ref 26.0–34.0)
MCHC: 33.3 g/dL (ref 32.0–36.0)
MCV: 97 fL (ref 81–101)
MONO#: 0.2 10*3/uL (ref 0.1–0.9)
MONO%: 5.3 % (ref 0.0–13.0)
NEUT#: 2.4 10*3/uL (ref 1.5–6.5)
NEUT%: 54.2 % (ref 39.6–80.0)
Platelets: 195 10*3/uL (ref 145–400)
RBC: 3.87 10*6/uL (ref 3.70–5.32)
RDW: 12 % (ref 11.1–15.7)
WBC: 4.5 10*3/uL (ref 3.9–10.0)

## 2013-10-13 LAB — COMPREHENSIVE METABOLIC PANEL
ALT: 15 U/L (ref 0–35)
AST: 18 U/L (ref 0–37)
Albumin: 4.4 g/dL (ref 3.5–5.2)
Alkaline Phosphatase: 65 U/L (ref 39–117)
BILIRUBIN TOTAL: 0.5 mg/dL (ref 0.3–1.2)
BUN: 22 mg/dL (ref 6–23)
CALCIUM: 9.6 mg/dL (ref 8.4–10.5)
CHLORIDE: 103 meq/L (ref 96–112)
CO2: 29 meq/L (ref 19–32)
CREATININE: 0.75 mg/dL (ref 0.50–1.10)
GLUCOSE: 96 mg/dL (ref 70–99)
Potassium: 3.7 mEq/L (ref 3.5–5.3)
Sodium: 139 mEq/L (ref 135–145)
Total Protein: 6.9 g/dL (ref 6.0–8.3)

## 2013-10-13 LAB — LACTATE DEHYDROGENASE: LDH: 128 U/L (ref 94–250)

## 2013-10-13 NOTE — Progress Notes (Signed)
This office note has been dictated.

## 2013-10-14 NOTE — Progress Notes (Signed)
CC:   Tommy Medal, M.D.  DIAGNOSES: 1. Stage III (T3 N2 M0) ductal carcinoma of the right breast --     remission x16 years. 2. History of stage I (T1 N0 M0) non-small cell lung cancer of the     right lung -- remission x23 years.  INTERIM HISTORY:  Ms. Lucy comes in for her yearly followup.  She likes to come back to see [place].  Last year, she had no problems.  There are no issues health-wise for her.  She has been staying active.  She and her husband have been doing a little bit of traveling, although did not do any last year.  Her last mammogram was done back in June 2014.  The mammogram looked fantastic with no problems that were noted.  She has had no problems with cough.  She has had no bony pain.  There has been no change in bowel or bladder habits.  She has had no rashes. There has been no change in medications.  PHYSICAL EXAMINATION:  General:  This is a well-developed, well- nourished white female in no obvious distress.  Vital Signs: Temperature of 97.7, pulse 77, respiratory rate 16, blood pressure 153/65, weight is 128 pounds.  Head and Neck:  Normocephalic, atraumatic skull.  There are no ocular or oral lesions.  There are no palpable cervical or supraclavicular lymph nodes.  Lungs:  Clear bilaterally. Cardiac:  Regular rate and rhythm with normal S1, S2.  There are no murmurs, rubs, or bruits.  Abdomen:  Soft.  She has good bowel sounds. There is no fluid wave.  There is no palpable abdominal mass.  There is no palpable hepatosplenomegaly.  Breasts:  Left breast with no masses, edema, or erythema.  There is no left axillary adenopathy.  Right chest wall shows well-healed mastectomy.  There are no right chest wall nodules.  There is no right chest wall erythema.  There is no right axillary adenopathy.  Back:  No kyphosis or osteoporotic changes are noted.  She has no tenderness over the spine, ribs, or hips. Extremities:  Some chronic mild lymphedema of the right  arm.  Lower extremity shows no clubbing, cyanosis, or edema.  She has good strength in her arms and legs.  Skin:  No rashes, ecchymosis, or petechia. Neurological:  No focal neurological deficits.  LABORATORY STUDIES:  White cell count is 4.5, hemoglobin 12.5, hematocrit 37.5, platelet count 195.  IMPRESSION:  Ms. Swisher is a very charming 68 year old white female. She has both history of stage IIIB breast cancer of the right breast and stage I lung cancer of the right lung.  She is in remission.  I think she is cured of all of these.  She still wants to come back to see [place] yearly and I do not have any problems with this.  I do not see need for any x-rays or lab studies right now.    ______________________________ Volanda Napoleon, M.D. PRE/MEDQ  D:  10/13/2013  T:  10/14/2013  Job:  [id number]

## 2014-02-23 ENCOUNTER — Other Ambulatory Visit: Payer: Self-pay

## 2014-02-23 DIAGNOSIS — Z9011 Acquired absence of right breast and nipple: Secondary | ICD-10-CM

## 2014-02-23 DIAGNOSIS — Z1231 Encounter for screening mammogram for malignant neoplasm of breast: Secondary | ICD-10-CM

## 2014-04-02 ENCOUNTER — Ambulatory Visit: Payer: Medicare Other

## 2014-04-02 ENCOUNTER — Ambulatory Visit
Admission: RE | Admit: 2014-04-02 | Discharge: 2014-04-02 | Disposition: A | Discharge status: Home / Self Care | Payer: 59 | Source: Ambulatory Visit

## 2014-04-02 DIAGNOSIS — Z9011 Acquired absence of right breast and nipple: Secondary | ICD-10-CM

## 2014-04-02 DIAGNOSIS — Z1231 Encounter for screening mammogram for malignant neoplasm of breast: Secondary | ICD-10-CM

## 2014-10-08 ENCOUNTER — Telehealth: Payer: Self-pay | Admitting: Hematology & Oncology

## 2014-10-08 NOTE — Telephone Encounter (Signed)
Pt moved 1-25 to 2-8

## 2014-10-12 ENCOUNTER — Ambulatory Visit: Payer: Medicare Other | Admitting: Hematology & Oncology

## 2014-10-12 ENCOUNTER — Other Ambulatory Visit: Payer: 59 | Admitting: Lab

## 2014-10-23 ENCOUNTER — Other Ambulatory Visit: Payer: Self-pay | Admitting: *Deleted

## 2014-10-23 DIAGNOSIS — Z853 Personal history of malignant neoplasm of breast: Secondary | ICD-10-CM

## 2014-10-26 ENCOUNTER — Ambulatory Visit (HOSPITAL_BASED_OUTPATIENT_CLINIC_OR_DEPARTMENT_OTHER): Payer: 59 | Admitting: Hematology & Oncology

## 2014-10-26 ENCOUNTER — Encounter: Payer: Self-pay | Admitting: Hematology & Oncology

## 2014-10-26 ENCOUNTER — Other Ambulatory Visit (HOSPITAL_BASED_OUTPATIENT_CLINIC_OR_DEPARTMENT_OTHER): Payer: 59 | Admitting: Lab

## 2014-10-26 VITALS — BP 127/82 | HR 78 | Temp 97.8°F | Resp 14 | Ht 63.0 in | Wt 131.0 lb

## 2014-10-26 DIAGNOSIS — C3491 Malignant neoplasm of unspecified part of right bronchus or lung: Secondary | ICD-10-CM

## 2014-10-26 DIAGNOSIS — Z85118 Personal history of other malignant neoplasm of bronchus and lung: Secondary | ICD-10-CM

## 2014-10-26 DIAGNOSIS — Z170 Estrogen receptor positive status [ER+]: Principal | ICD-10-CM

## 2014-10-26 DIAGNOSIS — Z853 Personal history of malignant neoplasm of breast: Secondary | ICD-10-CM

## 2014-10-26 DIAGNOSIS — C50911 Malignant neoplasm of unspecified site of right female breast: Secondary | ICD-10-CM

## 2014-10-26 LAB — CBC WITH DIFFERENTIAL (CANCER CENTER ONLY)
BASO#: 0 10*3/uL (ref 0.0–0.2)
BASO%: 0.2 % (ref 0.0–2.0)
EOS%: 0.4 % (ref 0.0–7.0)
Eosinophils Absolute: 0 10*3/uL (ref 0.0–0.5)
HEMATOCRIT: 40.8 % (ref 34.8–46.6)
HEMOGLOBIN: 13.9 g/dL (ref 11.6–15.9)
LYMPH#: 2 10*3/uL (ref 0.9–3.3)
LYMPH%: 41.6 % (ref 14.0–48.0)
MCH: 32.8 pg (ref 26.0–34.0)
MCHC: 34.1 g/dL (ref 32.0–36.0)
MCV: 96 fL (ref 81–101)
MONO#: 0.3 10*3/uL (ref 0.1–0.9)
MONO%: 5.4 % (ref 0.0–13.0)
NEUT%: 52.4 % (ref 39.6–80.0)
NEUTROS ABS: 2.5 10*3/uL (ref 1.5–6.5)
PLATELETS: 210 10*3/uL (ref 145–400)
RBC: 4.24 10*6/uL (ref 3.70–5.32)
RDW: 12 % (ref 11.1–15.7)
WBC: 4.8 10*3/uL (ref 3.9–10.0)

## 2014-10-26 LAB — CMP (CANCER CENTER ONLY)
ALT(SGPT): 21 U/L (ref 10–47)
AST: 29 U/L (ref 11–38)
Albumin: 4.3 g/dL (ref 3.3–5.5)
Alkaline Phosphatase: 60 U/L (ref 26–84)
BILIRUBIN TOTAL: 0.6 mg/dL (ref 0.20–1.60)
BUN: 14 mg/dL (ref 7–22)
CO2: 28 mEq/L (ref 18–33)
CREATININE: 0.7 mg/dL (ref 0.6–1.2)
Calcium: 10 mg/dL (ref 8.0–10.3)
Chloride: 99 mEq/L (ref 98–108)
Glucose, Bld: 91 mg/dL (ref 73–118)
Potassium: 4 mEq/L (ref 3.3–4.7)
Sodium: 138 mEq/L (ref 128–145)
TOTAL PROTEIN: 7.3 g/dL (ref 6.4–8.1)

## 2014-10-26 NOTE — Progress Notes (Signed)
Hematology and Oncology Follow Up Visit  Sophia Hernandez [id number] 02/09/1946 69 y.o. 10/26/2014   Principle Diagnosis:  Stage III (T3 N2 M0) ductal carcinoma of the right breast --     remission x16 years.  History of stage I (T1 N0 M0) non-small cell lung cancer of the     right lung -- remission x23 years.  Current Therapy:    Observation     Interim History:  Ms.  Hernandez is back for follow-up. We see her yearly. She's been doing quite well. She no problems last year.  She's had no problems with nausea or vomiting. There's been no cough. She's had no shortness of breath. She's had no bony pain.  She does take aspirin. She has take her vitamin D. Patient her last mammogram in July of last year. Everything looked fine. Her she's had no change in bowel or bladder habits. Patient does see a dermatologist for skin. So far, everything looked okay with her skin.  Medications:  Current outpatient prescriptions:  .  aspirin 81 MG tablet, Take 81 mg by mouth daily., Disp: , Rfl:  .  KLOR-CON M20 20 MEQ tablet, Take by mouth daily. , Disp: , Rfl:  .  losartan-hydrochlorothiazide (HYZAAR) 100-12.5 MG per tablet, Take 1 tablet by mouth daily., Disp: , Rfl:  .  Multiple Vitamins-Minerals (MULTIVITAMIN PO), Take by mouth every morning., Disp: , Rfl:  .  Omeprazole 20 MG TBEC, Take by mouth every other day., Disp: , Rfl:  .  simvastatin (ZOCOR) 20 MG tablet, Take 20 mg by mouth at bedtime. , Disp: , Rfl:  .  vitamin E (HM VITAMIN E) 400 UNIT capsule, Take 400 Units by mouth daily., Disp: , Rfl:   Allergies:  Allergies  Allergen Reactions  . Sulfa Antibiotics     rash  . Penicillins     REACTION: rash    Past Medical History, Surgical history, Social history, and Family History were reviewed and updated.  Review of Systems: As above  Physical Exam:  height is 5\' 3"  (1.6 m) and weight is 131 lb (59.421 kg). Her oral temperature is 97.8 F (36.6 C). Her blood pressure is 127/82 and  her pulse is 78. Her respiration is 14.   Well-developed and well-nourished white female in no obvious distress. Head and neck exam shows no ocular or oral lesions. There are no palpable cervical or supraclavicular lymph nodes. Lungs are clear. Cardiac exam regular rate and rhythm with no murmurs, rubs or bruits. Breast exam shows left breast with no masses, edema or erythema. There is no left axillary adenopathy. Right chest wall shows well-healed mastectomy. No right chest wall nodules are noted. There is no erythema on the right chest wall. She has no right axillary adenopathy. Abdomen is soft. She is good bowel sounds. There is no fluid wave. There is no palpable liver or spleen tip. Back exam shows no tenderness over the spine, ribs or hips. She has good range of motion of her joints. Neurological exam shows no focal neurological deficit. Skin exam shows no rashes, ecchymoses or petechia.  Lab Results  Component Value Date   WBC 4.8 10/26/2014   HGB 13.9 10/26/2014   HCT 40.8 10/26/2014   MCV 96 10/26/2014   PLT 210 10/26/2014     Chemistry      Component Value Date/Time   NA 139 10/13/2013 1042   K 3.7 10/13/2013 1042   CL 103 10/13/2013 1042   CO2 29 10/13/2013 1042  BUN 22 10/13/2013 1042   CREATININE 0.75 10/13/2013 1042      Component Value Date/Time   CALCIUM 9.6 10/13/2013 1042   ALKPHOS 65 10/13/2013 1042   AST 18 10/13/2013 1042   ALT 15 10/13/2013 1042   BILITOT 0.5 10/13/2013 1042         Impression and Plan: Sophia Hernandez is 69 year old white female. She has both a history of breast cancer and lung cancer. Both of these were close to 20 years ago. The breast cancer was 17 years ago. The lung cancer was 24 years ago.  I does don't see any problems that she will have with these.  For now, we will go ahead and plan to get her back to be seen in one year.   Volanda Napoleon, MD 2/8/201610:50 AM

## 2015-04-05 ENCOUNTER — Other Ambulatory Visit: Payer: Self-pay

## 2015-04-05 DIAGNOSIS — Z1231 Encounter for screening mammogram for malignant neoplasm of breast: Secondary | ICD-10-CM

## 2015-04-05 DIAGNOSIS — Z9011 Acquired absence of right breast and nipple: Secondary | ICD-10-CM

## 2015-04-08 ENCOUNTER — Ambulatory Visit
Admission: RE | Admit: 2015-04-08 | Discharge: 2015-04-08 | Disposition: A | Discharge status: Home / Self Care | Payer: Medicare Other | Source: Ambulatory Visit

## 2015-04-08 DIAGNOSIS — Z9011 Acquired absence of right breast and nipple: Secondary | ICD-10-CM

## 2015-04-08 DIAGNOSIS — Z1231 Encounter for screening mammogram for malignant neoplasm of breast: Secondary | ICD-10-CM

## 2015-08-05 ENCOUNTER — Encounter: Payer: Self-pay | Admitting: Internal Medicine

## 2015-10-27 ENCOUNTER — Encounter: Payer: Self-pay | Admitting: Hematology & Oncology

## 2015-10-27 ENCOUNTER — Other Ambulatory Visit: Payer: Self-pay | Admitting: *Deleted

## 2015-10-27 ENCOUNTER — Other Ambulatory Visit (HOSPITAL_BASED_OUTPATIENT_CLINIC_OR_DEPARTMENT_OTHER): Payer: Medicare Other

## 2015-10-27 ENCOUNTER — Ambulatory Visit (HOSPITAL_BASED_OUTPATIENT_CLINIC_OR_DEPARTMENT_OTHER): Payer: Medicare Other | Admitting: Hematology & Oncology

## 2015-10-27 VITALS — BP 159/78 | HR 66 | Temp 97.5°F | Resp 18 | Ht 63.0 in | Wt 127.0 lb

## 2015-10-27 DIAGNOSIS — Z853 Personal history of malignant neoplasm of breast: Secondary | ICD-10-CM

## 2015-10-27 DIAGNOSIS — E559 Vitamin D deficiency, unspecified: Secondary | ICD-10-CM

## 2015-10-27 DIAGNOSIS — H8110 Benign paroxysmal vertigo, unspecified ear: Secondary | ICD-10-CM | POA: Diagnosis not present

## 2015-10-27 LAB — CBC WITH DIFFERENTIAL (CANCER CENTER ONLY)
BASO#: 0 10*3/uL (ref 0.0–0.2)
BASO%: 0.4 % (ref 0.0–2.0)
EOS ABS: 0 10*3/uL (ref 0.0–0.5)
EOS%: 0.6 % (ref 0.0–7.0)
HCT: 39.1 % (ref 34.8–46.6)
HGB: 13.4 g/dL (ref 11.6–15.9)
LYMPH#: 2.5 10*3/uL (ref 0.9–3.3)
LYMPH%: 46.6 % (ref 14.0–48.0)
MCH: 32.4 pg (ref 26.0–34.0)
MCHC: 34.3 g/dL (ref 32.0–36.0)
MCV: 95 fL (ref 81–101)
MONO#: 0.4 10*3/uL (ref 0.1–0.9)
MONO%: 6.5 % (ref 0.0–13.0)
NEUT%: 45.9 % (ref 39.6–80.0)
NEUTROS ABS: 2.5 10*3/uL (ref 1.5–6.5)
PLATELETS: 182 10*3/uL (ref 145–400)
RBC: 4.13 10*6/uL (ref 3.70–5.32)
RDW: 12.1 % (ref 11.1–15.7)
WBC: 5.4 10*3/uL (ref 3.9–10.0)

## 2015-10-27 NOTE — Progress Notes (Signed)
Hematology and Oncology Follow Up Visit  Sophia Hernandez [id number] Apr 11, 1946 70 y.o. 10/27/2015   Principle Diagnosis:  Stage III (T3 N2 M0) ductal carcinoma of the right breast --     remission x16 years.  History of stage I (T1 N0 M0) non-small cell lung cancer of the     right lung -- remission x23 years.  Current Therapy:    Observation     Interim History:  Ms.  Hernandez is back for follow-up. We see her yearly. Her main problem now that she is developed benign positional vertigo. She sees a therapist for this. Thankfully, she does not have any flareups. She is not on any medicine for this right now.   Otherwise, she is doing pretty well. She's had no cough. She's had no fever. She's had no change in bowel or bladder habits.   Her last mammogram was back in July 2016. Everything looked okay with respect to the left breast.   Overall, her performance status is ECOG 1.   Medications:  Current outpatient prescriptions:  .  aspirin 81 MG tablet, Take 81 mg by mouth daily., Disp: , Rfl:  .  KLOR-CON M20 20 MEQ tablet, Take by mouth daily. , Disp: , Rfl:  .  losartan-hydrochlorothiazide (HYZAAR) 100-12.5 MG per tablet, Take 1 tablet by mouth daily., Disp: , Rfl:  .  Multiple Vitamins-Minerals (MULTIVITAMIN PO), Take by mouth every morning., Disp: , Rfl:  .  Omeprazole 20 MG TBEC, Take by mouth every other day., Disp: , Rfl:  .  simvastatin (ZOCOR) 20 MG tablet, Take 20 mg by mouth at bedtime. , Disp: , Rfl:  .  vitamin E (HM VITAMIN E) 400 UNIT capsule, Take 400 Units by mouth daily., Disp: , Rfl:   Allergies:  Allergies  Allergen Reactions  . Sulfa Antibiotics     rash  . Penicillins     REACTION: rash    Past Medical History, Surgical history, Social history, and Family History were reviewed and updated.  Review of Systems: As above  Physical Exam:  height is '5\' 3"'$  (1.6 m) and weight is 127 lb (57.607 kg). Her oral temperature is 97.5 F (36.4 C). Her blood pressure  is 159/78 and her pulse is 66. Her respiration is 18.   Well-developed and well-nourished white female in no obvious distress. Head and neck exam shows no ocular or oral lesions. There are no palpable cervical or supraclavicular lymph nodes. Lungs are clear. Cardiac exam regular rate and rhythm with no murmurs, rubs or bruits. Breast exam shows left breast with no masses, edema or erythema. There is no left axillary adenopathy. Right chest wall shows well-healed mastectomy. No right chest wall nodules are noted. There is no erythema on the right chest wall. She has no right axillary adenopathy. Abdomen is soft. She is good bowel sounds. There is no fluid wave. There is no palpable liver or spleen tip. Back exam shows no tenderness over the spine, ribs or hips. She has good range of motion of her joints. Neurological exam shows no focal neurological deficit. Skin exam shows no rashes, ecchymoses or petechia.  Lab Results  Component Value Date   WBC 5.4 10/27/2015   HGB 13.4 10/27/2015   HCT 39.1 10/27/2015   MCV 95 10/27/2015   PLT 182 10/27/2015     Chemistry      Component Value Date/Time   NA 138 10/26/2014 1042   NA 139 10/13/2013 1042   K 4.0 Sl hemolysis  10/26/2014 1042   K 3.7 10/13/2013 1042   CL 99 10/26/2014 1042   CL 103 10/13/2013 1042   CO2 28 10/26/2014 1042   CO2 29 10/13/2013 1042   BUN 14 10/26/2014 1042   BUN 22 10/13/2013 1042   CREATININE 0.7 10/26/2014 1042   CREATININE 0.75 10/13/2013 1042      Component Value Date/Time   CALCIUM 10.0 10/26/2014 1042   CALCIUM 9.6 10/13/2013 1042   ALKPHOS 60 10/26/2014 1042   ALKPHOS 65 10/13/2013 1042   AST 29 10/26/2014 1042   AST 18 10/13/2013 1042   ALT 21 10/26/2014 1042   ALT 15 10/13/2013 1042   BILITOT 0.60 10/26/2014 1042   BILITOT 0.5 10/13/2013 1042         Impression and Plan: Sophia Hernandez is 70 year old white female. She has both a history of breast cancer and lung cancer. Both of these were close to 20  years ago. The breast cancer was 17 years ago. The lung cancer was 24 years ago.  I feel bad about the vertigo that she has. Hopefully, this will not cause many issues for her in the future.  I will plan to see her back in one year.   Volanda Napoleon, MD 2/8/20171:01 PM

## 2015-10-28 LAB — VITAMIN D 25 HYDROXY (VIT D DEFICIENCY, FRACTURES): VIT D 25 HYDROXY: 59.2 ng/mL (ref 30.0–100.0)

## 2016-03-20 ENCOUNTER — Other Ambulatory Visit: Payer: Self-pay | Admitting: Hematology & Oncology

## 2016-03-20 DIAGNOSIS — Z1231 Encounter for screening mammogram for malignant neoplasm of breast: Secondary | ICD-10-CM

## 2016-04-11 ENCOUNTER — Ambulatory Visit: Payer: Medicare Other

## 2016-04-21 ENCOUNTER — Ambulatory Visit
Admission: RE | Admit: 2016-04-21 | Discharge: 2016-04-21 | Disposition: A | Discharge status: Home / Self Care | Payer: Medicare Other | Source: Ambulatory Visit | Attending: Hematology & Oncology | Admitting: Hematology & Oncology

## 2016-04-21 DIAGNOSIS — Z1231 Encounter for screening mammogram for malignant neoplasm of breast: Secondary | ICD-10-CM

## 2016-10-26 ENCOUNTER — Ambulatory Visit (HOSPITAL_BASED_OUTPATIENT_CLINIC_OR_DEPARTMENT_OTHER): Payer: Medicare Other | Admitting: Hematology & Oncology

## 2016-10-26 ENCOUNTER — Other Ambulatory Visit (HOSPITAL_BASED_OUTPATIENT_CLINIC_OR_DEPARTMENT_OTHER): Payer: Medicare Other

## 2016-10-26 VITALS — BP 141/98 | HR 77 | Temp 97.9°F | Wt 133.1 lb

## 2016-10-26 DIAGNOSIS — Z853 Personal history of malignant neoplasm of breast: Secondary | ICD-10-CM

## 2016-10-26 DIAGNOSIS — Z85118 Personal history of other malignant neoplasm of bronchus and lung: Secondary | ICD-10-CM

## 2016-10-26 DIAGNOSIS — E559 Vitamin D deficiency, unspecified: Secondary | ICD-10-CM

## 2016-10-26 LAB — COMPREHENSIVE METABOLIC PANEL
ALBUMIN: 4.3 g/dL (ref 3.5–5.0)
ALK PHOS: 71 U/L (ref 40–150)
ALT: 19 U/L (ref 0–55)
ANION GAP: 9 meq/L (ref 3–11)
AST: 20 U/L (ref 5–34)
BILIRUBIN TOTAL: 0.63 mg/dL (ref 0.20–1.20)
BUN: 20.1 mg/dL (ref 7.0–26.0)
CO2: 28 meq/L (ref 22–29)
Calcium: 10.2 mg/dL (ref 8.4–10.4)
Chloride: 101 mEq/L (ref 98–109)
Creatinine: 0.8 mg/dL (ref 0.6–1.1)
EGFR: 78 mL/min/{1.73_m2} — ABNORMAL LOW (ref >90)
Glucose: 96 mg/dl (ref 70–140)
POTASSIUM: 4.2 meq/L (ref 3.5–5.1)
Sodium: 138 mEq/L (ref 136–145)
Total Protein: 7.5 g/dL (ref 6.4–8.3)

## 2016-10-26 LAB — CBC WITH DIFFERENTIAL (CANCER CENTER ONLY)
BASO#: 0 10*3/uL (ref 0.0–0.2)
BASO%: 0.2 % (ref 0.0–2.0)
EOS ABS: 0 10*3/uL (ref 0.0–0.5)
EOS%: 0.6 % (ref 0.0–7.0)
HCT: 39.7 % (ref 34.8–46.6)
HGB: 13.7 g/dL (ref 11.6–15.9)
LYMPH#: 2.2 10*3/uL (ref 0.9–3.3)
LYMPH%: 43.6 % (ref 14.0–48.0)
MCH: 32.9 pg (ref 26.0–34.0)
MCHC: 34.5 g/dL (ref 32.0–36.0)
MCV: 95 fL (ref 81–101)
MONO#: 0.4 10*3/uL (ref 0.1–0.9)
MONO%: 7.3 % (ref 0.0–13.0)
NEUT#: 2.5 10*3/uL (ref 1.5–6.5)
NEUT%: 48.3 % (ref 39.6–80.0)
PLATELETS: 209 10*3/uL (ref 145–400)
RBC: 4.17 10*6/uL (ref 3.70–5.32)
RDW: 12.2 % (ref 11.1–15.7)
WBC: 5.1 10*3/uL (ref 3.9–10.0)

## 2016-10-26 NOTE — Progress Notes (Signed)
Hematology and Oncology Follow Up Visit  Sophia Hernandez [id number] 1945-11-14 71 y.o. 10/26/2016   Principle Diagnosis:  Stage III (T3 N2 M0) ductal carcinoma of the right breast --     remission x16 years.  History of stage I (T1 N0 M0) non-small cell lung cancer of the     right lung -- remission x23 years.  Current Therapy:    Observation     Interim History:  Ms.  Hernandez is back for follow-up. We see her yearly. She has a lot of arthralgias. She actually goes to an acupuncturist now. This really has helped her out. She is very thankful that the acupuncture has been effective.   Her last mammogram was back in June. Everything looked good.  She got through all of last year without any infections. She got through the holidays. She is looking for to a busy year in 2018   Overall, her performance status is ECOG 1.   Medications:  Current Outpatient Prescriptions:  .  aspirin 81 MG tablet, Take 81 mg by mouth daily., Disp: , Rfl:  .  KLOR-CON M20 20 MEQ tablet, Take by mouth daily. , Disp: , Rfl:  .  losartan-hydrochlorothiazide (HYZAAR) 100-12.5 MG per tablet, Take 1 tablet by mouth daily., Disp: , Rfl:  .  Multiple Vitamins-Minerals (MULTIVITAMIN PO), Take by mouth every morning., Disp: , Rfl:  .  Omeprazole 20 MG TBEC, Take by mouth every other day., Disp: , Rfl:  .  simvastatin (ZOCOR) 20 MG tablet, Take 20 mg by mouth at bedtime. , Disp: , Rfl:  .  vitamin E (HM VITAMIN E) 400 UNIT capsule, Take 5,000 Units by mouth daily. , Disp: , Rfl:   Allergies:  Allergies  Allergen Reactions  . Sulfa Antibiotics     rash  . Penicillins     REACTION: rash    Past Medical History, Surgical history, Social history, and Family History were reviewed and updated.  Review of Systems: As above  Physical Exam:  weight is 133 lb 1 oz (60.4 kg). Her oral temperature is 97.9 F (36.6 C). Her blood pressure is 141/98 (abnormal) and her pulse is 77.   Well-developed and well-nourished  white female in no obvious distress. Head and neck exam shows no ocular or oral lesions. There are no palpable cervical or supraclavicular lymph nodes. Lungs are clear. Cardiac exam regular rate and rhythm with no murmurs, rubs or bruits. Breast exam shows left breast with no masses, edema or erythema. There is no left axillary adenopathy. Right chest wall shows well-healed mastectomy. No right chest wall nodules are noted. There is no erythema on the right chest wall. She has no right axillary adenopathy. Abdomen is soft. She is good bowel sounds. There is no fluid wave. There is no palpable liver or spleen tip. Back exam shows no tenderness over the spine, ribs or hips. She has good range of motion of her joints. Neurological exam shows no focal neurological deficit. Skin exam shows no rashes, ecchymoses or petechia.  Lab Results  Component Value Date   WBC 5.1 10/26/2016   HGB 13.7 10/26/2016   HCT 39.7 10/26/2016   MCV 95 10/26/2016   PLT 209 10/26/2016     Chemistry      Component Value Date/Time   NA 138 10/26/2014 1042   K 4.0 Sl hemolysis 10/26/2014 1042   CL 99 10/26/2014 1042   CO2 28 10/26/2014 1042   BUN 14 10/26/2014 1042   CREATININE 0.7  10/26/2014 1042      Component Value Date/Time   CALCIUM 10.0 10/26/2014 1042   ALKPHOS 60 10/26/2014 1042   AST 29 10/26/2014 1042   ALT 21 10/26/2014 1042   BILITOT 0.60 10/26/2014 1042         Impression and Plan: Sophia Hernandez is 71 year old white female. She has both a history of breast cancer and lung cancer. Both of these were close to 20 years ago. The breast cancer was 18 years ago. The lung cancer was 25 years ago.  She likes to come back to see [place]. She just feels more reassured since we do a thorough examination on her. I'm sure that her family doctor is just so busy that he might not be able to examine her and the aorta talked to her like we can. I certainly appreciate how much he is done to help her out.  I will plan to  see her back in one year.   Volanda Napoleon, MD 2/8/201811:53 AM

## 2016-10-27 LAB — VITAMIN D 25 HYDROXY (VIT D DEFICIENCY, FRACTURES): Vitamin D, 25-Hydroxy: 62.1 ng/mL (ref 30.0–100.0)

## 2017-02-22 ENCOUNTER — Encounter: Payer: Self-pay | Admitting: Hematology & Oncology

## 2017-03-14 ENCOUNTER — Other Ambulatory Visit: Payer: Self-pay | Admitting: Hematology & Oncology

## 2017-03-14 DIAGNOSIS — Z1231 Encounter for screening mammogram for malignant neoplasm of breast: Secondary | ICD-10-CM

## 2017-04-24 ENCOUNTER — Ambulatory Visit
Admission: RE | Admit: 2017-04-24 | Discharge: 2017-04-24 | Disposition: A | Discharge status: Home / Self Care | Payer: Medicare Other | Source: Ambulatory Visit | Attending: Hematology & Oncology | Admitting: Hematology & Oncology

## 2017-04-24 DIAGNOSIS — Z1231 Encounter for screening mammogram for malignant neoplasm of breast: Secondary | ICD-10-CM

## 2017-10-25 ENCOUNTER — Inpatient Hospital Stay (HOSPITAL_BASED_OUTPATIENT_CLINIC_OR_DEPARTMENT_OTHER): Payer: Medicare Other | Admitting: Hematology & Oncology

## 2017-10-25 ENCOUNTER — Encounter: Payer: Self-pay | Admitting: Hematology & Oncology

## 2017-10-25 ENCOUNTER — Inpatient Hospital Stay: Payer: Medicare Other | Attending: Hematology & Oncology

## 2017-10-25 ENCOUNTER — Other Ambulatory Visit: Payer: Self-pay

## 2017-10-25 VITALS — BP 162/83 | HR 78 | Temp 97.6°F | Resp 16 | Wt 138.0 lb

## 2017-10-25 DIAGNOSIS — Z853 Personal history of malignant neoplasm of breast: Secondary | ICD-10-CM | POA: Insufficient documentation

## 2017-10-25 DIAGNOSIS — Z85118 Personal history of other malignant neoplasm of bronchus and lung: Secondary | ICD-10-CM | POA: Insufficient documentation

## 2017-10-25 LAB — CMP (CANCER CENTER ONLY)
ALT: 15 U/L (ref 0–55)
AST: 18 U/L (ref 5–34)
Albumin: 4.2 g/dL (ref 3.5–5.0)
Alkaline Phosphatase: 66 U/L (ref 40–150)
Anion gap: 10 (ref 3–11)
BILIRUBIN TOTAL: 0.5 mg/dL (ref 0.2–1.2)
BUN: 23 mg/dL (ref 7–26)
CALCIUM: 9.5 mg/dL (ref 8.4–10.4)
CO2: 26 mmol/L (ref 22–29)
CREATININE: 0.93 mg/dL (ref 0.60–1.10)
Chloride: 103 mmol/L (ref 98–109)
GFR, EST NON AFRICAN AMERICAN: 60 mL/min — AB (ref >60)
Glucose, Bld: 94 mg/dL (ref 70–140)
Potassium: 3.6 mmol/L (ref 3.5–5.1)
Sodium: 139 mmol/L (ref 136–145)
TOTAL PROTEIN: 7.3 g/dL (ref 6.4–8.3)

## 2017-10-25 LAB — CBC WITH DIFFERENTIAL (CANCER CENTER ONLY)
Basophils Absolute: 0 10*3/uL (ref 0.0–0.1)
Basophils Relative: 0 %
EOS PCT: 1 %
Eosinophils Absolute: 0 10*3/uL (ref 0.0–0.5)
HEMATOCRIT: 39.5 % (ref 34.8–46.6)
Hemoglobin: 13.4 g/dL (ref 11.6–15.9)
LYMPHS PCT: 45 %
Lymphs Abs: 2.4 10*3/uL (ref 0.9–3.3)
MCH: 32.9 pg (ref 26.0–34.0)
MCHC: 33.9 g/dL (ref 32.0–36.0)
MCV: 97.1 fL (ref 81.0–101.0)
Monocytes Absolute: 0.3 10*3/uL (ref 0.1–0.9)
Monocytes Relative: 6 %
NEUTROS ABS: 2.5 10*3/uL (ref 1.5–6.5)
Neutrophils Relative %: 48 %
PLATELETS: 234 10*3/uL (ref 145–400)
RBC: 4.07 MIL/uL (ref 3.70–5.32)
RDW: 12.1 % (ref 11.1–15.7)
WBC: 5.3 10*3/uL (ref 3.9–10.0)

## 2017-10-25 NOTE — Progress Notes (Signed)
Hematology and Oncology Follow Up Visit  Sophia Hernandez [id number] [birth year] 72 y.o. 10/25/2017   Principle Diagnosis:  Stage III (T3 N2 M0) ductal carcinoma of the right breast --     remission x16 years.  History of stage I (T1 N0 M0) non-small cell lung cancer of the     right lung -- remission x23 years.  Current Therapy:    Observation     Interim History:  Sophia Hernandez is back for follow-up. We see her yearly.  Her arthritis continues to be a problem for her.  She might see a massage therapist.  Otherwise, she is doing well.  She is had no problems with nausea or vomiting.  She has had no cough or shortness of breath.  She has had no issues with infections.  She has had no change in bowel or bladder habits.  There is been no rashes.  She has had no leg swelling.  Overall, her performance status is ECOG 1.   Medications:  Current Outpatient Medications:  .  aspirin 81 MG tablet, Take 81 mg by mouth daily., Disp: , Rfl:  .  KLOR-CON M20 20 MEQ tablet, Take by mouth daily. , Disp: , Rfl:  .  losartan-hydrochlorothiazide (HYZAAR) 100-12.5 MG per tablet, Take 1 tablet by mouth daily., Disp: , Rfl:  .  Multiple Vitamins-Minerals (MULTIVITAMIN PO), Take by mouth every morning., Disp: , Rfl:  .  Omeprazole 20 MG TBEC, Take by mouth every other day., Disp: , Rfl:  .  simvastatin (ZOCOR) 20 MG tablet, Take 20 mg by mouth at bedtime. , Disp: , Rfl:  .  vitamin E (HM VITAMIN E) 400 UNIT capsule, Take 5,000 Units by mouth daily. , Disp: , Rfl:   Allergies:  Allergies  Allergen Reactions  . Sulfa Antibiotics     rash  . Penicillins     REACTION: rash    Past Medical History, Surgical history, Social history, and Family History were reviewed and updated.  Review of Systems: Review of Systems  Musculoskeletal: Positive for back pain and joint pain.  All other systems reviewed and are negative.   Physical Exam:  weight is 138 lb (62.6 kg). Her oral temperature is 97.6 F  (36.4 C). Her blood pressure is 162/83 (abnormal) and her pulse is 78. Her respiration is 16 and oxygen saturation is 100%.   Physical Exam  Constitutional: She is oriented to person, place, and time.  HENT:  Head: Normocephalic and atraumatic.  Mouth/Throat: Oropharynx is clear and moist.  Eyes: EOM are normal. Pupils are equal, round, and reactive to light.  Neck: Normal range of motion.  Cardiovascular: Normal rate, regular rhythm and normal heart sounds.  Pulmonary/Chest: Effort normal and breath sounds normal.  Abdominal: Soft. Bowel sounds are normal.  Musculoskeletal: Normal range of motion. She exhibits no edema, tenderness or deformity.  Lymphadenopathy:    She has no cervical adenopathy.  Neurological: She is alert and oriented to person, place, and time.  Skin: Skin is warm and dry. No rash noted. No erythema.  Psychiatric: She has a normal mood and affect. Her behavior is normal. Judgment and thought content normal.  Vitals reviewed.    Lab Results  Component Value Date   WBC 5.3 10/25/2017   HGB 13.7 10/26/2016   HCT 39.5 10/25/2017   MCV 97.1 10/25/2017   PLT 234 10/25/2017     Chemistry      Component Value Date/Time   NA 138 10/26/2016 1032  K 4.2 10/26/2016 1032   CL 99 10/26/2014 1042   CO2 28 10/26/2016 1032   BUN 20.1 10/26/2016 1032   CREATININE 0.8 10/26/2016 1032      Component Value Date/Time   CALCIUM 10.2 10/26/2016 1032   ALKPHOS 71 10/26/2016 1032   AST 20 10/26/2016 1032   ALT 19 10/26/2016 1032   BILITOT 0.63 10/26/2016 1032      Impression and Plan: Ms. Casstevens is 72 year old white female. She has both a history of breast cancer and lung cancer. Both of these were close to 20 years ago. The breast cancer was 18 years ago. The lung cancer was 25 years ago.  Right now, everything looks fantastic.  I do not see any issue with respect to recurrent breast cancer or lung cancer.  I will plan to have her come back in 1 more year.     Volanda Napoleon, MD 2/7/201912:53 PM

## 2018-04-01 ENCOUNTER — Other Ambulatory Visit: Payer: Self-pay | Admitting: Hematology & Oncology

## 2018-04-01 DIAGNOSIS — Z1231 Encounter for screening mammogram for malignant neoplasm of breast: Secondary | ICD-10-CM

## 2018-05-02 ENCOUNTER — Ambulatory Visit
Admission: RE | Admit: 2018-05-02 | Discharge: 2018-05-02 | Disposition: A | Discharge status: Home / Self Care | Payer: Medicare Other | Source: Ambulatory Visit | Attending: Hematology & Oncology | Admitting: Hematology & Oncology

## 2018-05-02 DIAGNOSIS — Z1231 Encounter for screening mammogram for malignant neoplasm of breast: Secondary | ICD-10-CM

## 2018-10-18 ENCOUNTER — Telehealth: Payer: Self-pay | Admitting: Hematology & Oncology

## 2018-10-18 NOTE — Telephone Encounter (Signed)
Called and spoke with spouse and advised him of appointment changes.  I explained to him that I would mail copy of the schedule as well.  R/S due to Dr Marin Olp on call 2/7

## 2018-10-25 ENCOUNTER — Other Ambulatory Visit: Payer: Medicare Other

## 2018-10-25 ENCOUNTER — Ambulatory Visit: Payer: Medicare Other | Admitting: Hematology & Oncology

## 2018-11-08 ENCOUNTER — Other Ambulatory Visit: Payer: Self-pay | Admitting: *Deleted

## 2018-11-08 DIAGNOSIS — Z853 Personal history of malignant neoplasm of breast: Secondary | ICD-10-CM

## 2018-11-11 ENCOUNTER — Ambulatory Visit: Payer: Medicare Other | Admitting: Hematology & Oncology

## 2018-11-11 ENCOUNTER — Inpatient Hospital Stay: Payer: Medicare Other

## 2018-11-11 ENCOUNTER — Inpatient Hospital Stay: Payer: Medicare Other | Admitting: Hematology & Oncology

## 2018-11-11 ENCOUNTER — Other Ambulatory Visit: Payer: Medicare Other

## 2019-03-12 ENCOUNTER — Encounter: Payer: Self-pay | Admitting: Hematology & Oncology

## 2019-03-12 ENCOUNTER — Inpatient Hospital Stay: Payer: Medicare Other | Attending: Hematology & Oncology

## 2019-03-12 ENCOUNTER — Inpatient Hospital Stay (HOSPITAL_BASED_OUTPATIENT_CLINIC_OR_DEPARTMENT_OTHER): Payer: Medicare Other | Admitting: Hematology & Oncology

## 2019-03-12 ENCOUNTER — Telehealth: Payer: Self-pay | Admitting: Hematology & Oncology

## 2019-03-12 ENCOUNTER — Other Ambulatory Visit: Payer: Self-pay

## 2019-03-12 VITALS — BP 156/77 | HR 83 | Temp 98.3°F | Resp 20 | Wt 131.0 lb

## 2019-03-12 DIAGNOSIS — Z853 Personal history of malignant neoplasm of breast: Secondary | ICD-10-CM

## 2019-03-12 DIAGNOSIS — Z85118 Personal history of other malignant neoplasm of bronchus and lung: Secondary | ICD-10-CM | POA: Insufficient documentation

## 2019-03-12 LAB — CBC WITH DIFFERENTIAL (CANCER CENTER ONLY)
Abs Immature Granulocytes: 0.02 10*3/uL (ref 0.00–0.07)
Basophils Absolute: 0 10*3/uL (ref 0.0–0.1)
Basophils Relative: 0 %
Eosinophils Absolute: 0 10*3/uL (ref 0.0–0.5)
Eosinophils Relative: 0 %
HCT: 39 % (ref 36.0–46.0)
Hemoglobin: 13.2 g/dL (ref 12.0–15.0)
Immature Granulocytes: 0 %
Lymphocytes Relative: 40 %
Lymphs Abs: 2.1 10*3/uL (ref 0.7–4.0)
MCH: 32.6 pg (ref 26.0–34.0)
MCHC: 33.8 g/dL (ref 30.0–36.0)
MCV: 96.3 fL (ref 80.0–100.0)
Monocytes Absolute: 0.3 10*3/uL (ref 0.1–1.0)
Monocytes Relative: 6 %
Neutro Abs: 2.8 10*3/uL (ref 1.7–7.7)
Neutrophils Relative %: 54 %
Platelet Count: 219 10*3/uL (ref 150–400)
RBC: 4.05 MIL/uL (ref 3.87–5.11)
RDW: 12 % (ref 11.5–15.5)
WBC Count: 5.3 10*3/uL (ref 4.0–10.5)
nRBC: 0 % (ref 0.0–0.2)

## 2019-03-12 LAB — CMP (CANCER CENTER ONLY)
ALT: 15 U/L (ref 0–44)
AST: 18 U/L (ref 15–41)
Albumin: 4.7 g/dL (ref 3.5–5.0)
Alkaline Phosphatase: 57 U/L (ref 38–126)
Anion gap: 8 (ref 5–15)
BUN: 20 mg/dL (ref 8–23)
CO2: 30 mmol/L (ref 22–32)
Calcium: 9.8 mg/dL (ref 8.9–10.3)
Chloride: 101 mmol/L (ref 98–111)
Creatinine: 0.77 mg/dL (ref 0.44–1.00)
GFR, Est AFR Am: >60 mL/min (ref >60)
GFR, Estimated: >60 mL/min (ref >60)
Glucose, Bld: 104 mg/dL — ABNORMAL HIGH (ref 70–99)
Potassium: 4.1 mmol/L (ref 3.5–5.1)
Sodium: 139 mmol/L (ref 135–145)
Total Bilirubin: 0.6 mg/dL (ref 0.3–1.2)
Total Protein: 6.8 g/dL (ref 6.5–8.1)

## 2019-03-12 NOTE — Progress Notes (Signed)
Hematology and Oncology Follow Up Visit  Sophia Hernandez [id number] [birth year] 73 y.o. 03/12/2019   Principle Diagnosis:  Stage III (T3 N2 M0) ductal carcinoma of the right breast --     remission x16 years.  History of stage I (T1 N0 M0) non-small cell lung cancer of the     right lung -- remission x23 years.  Current Therapy:    Observation     Interim History:  Ms.  Hernandez is back for follow-up. We see her yearly.  She is doing pretty well.  She is slowing down a little bit.  However, she is still managing to be active.  Her brother who was a had a stroke.  He was in [place].  Thankfully, he is doing a little better right now.    Her arthritis continues to be a problem for her.  She might see a massage therapist.  Otherwise, she is doing well.  She is had no problems with nausea or vomiting.  She has had no cough or shortness of breath.  She has had no issues with infections.  She has had no change in bowel or bladder habits.  There is been no rashes.  She has had no leg swelling.  Overall, her performance status is ECOG 1.   Medications:  Current Outpatient Medications:  .  aspirin 81 MG tablet, Take 81 mg by mouth daily., Disp: , Rfl:  .  KLOR-CON M20 20 MEQ tablet, Take by mouth daily. , Disp: , Rfl:  .  losartan-hydrochlorothiazide (HYZAAR) 100-12.5 MG per tablet, Take 1 tablet by mouth daily., Disp: , Rfl:  .  Multiple Vitamins-Minerals (MULTIVITAMIN PO), Take by mouth every morning., Disp: , Rfl:  .  Omega-3 1000 MG CAPS, Take by mouth 3 (three) times a week., Disp: , Rfl:  .  Omeprazole 20 MG TBEC, Take by mouth every 3 (three) days. , Disp: , Rfl:  .  simvastatin (ZOCOR) 20 MG tablet, Take 20 mg by mouth at bedtime. , Disp: , Rfl:  .  vitamin B-12 (CYANOCOBALAMIN) 100 MCG tablet, Take 100 mcg by mouth daily., Disp: , Rfl:  .  vitamin E (HM VITAMIN E) 400 UNIT capsule, Take 5,000 Units by mouth daily. , Disp: , Rfl:   Allergies:  Allergies  Allergen Reactions   . Sulfa Antibiotics     rash  . Penicillins     REACTION: rash    Past Medical History, Surgical history, Social history, and Family History were reviewed and updated.  Review of Systems: Review of Systems  Musculoskeletal: Positive for back pain and joint pain.  All other systems reviewed and are negative.   Physical Exam:  weight is 131 lb (59.4 kg). Her oral temperature is 98.3 F (36.8 C). Her blood pressure is 156/77 (abnormal) and her pulse is 83. Her respiration is 20 and oxygen saturation is 99%.   Physical Exam Vitals signs reviewed.  Constitutional:      Comments: Breast exam shows left breast no masses, edema or erythema.  There is no left axillary adenopathy.  Right chest wall shows well-healed mastectomy.  There is no nodules or erythema along the mastectomy site.  There is no right axillary adenopathy.  HENT:     Head: Normocephalic and atraumatic.  Eyes:     Pupils: Pupils are equal, round, and reactive to light.  Neck:     Musculoskeletal: Normal range of motion.  Cardiovascular:     Rate and Rhythm: Normal rate and regular rhythm.  Heart sounds: Normal heart sounds.  Pulmonary:     Effort: Pulmonary effort is normal.     Breath sounds: Normal breath sounds.  Abdominal:     General: Bowel sounds are normal.     Palpations: Abdomen is soft.  Musculoskeletal: Normal range of motion.        General: No tenderness or deformity.  Lymphadenopathy:     Cervical: No cervical adenopathy.  Skin:    General: Skin is warm and dry.     Findings: No erythema or rash.  Neurological:     Mental Status: She is alert and oriented to person, place, and time.  Psychiatric:        Behavior: Behavior normal.        Thought Content: Thought content normal.        Judgment: Judgment normal.      Lab Results  Component Value Date   WBC 5.3 03/12/2019   HGB 13.2 03/12/2019   HCT 39.0 03/12/2019   MCV 96.3 03/12/2019   PLT 219 03/12/2019     Chemistry       Component Value Date/Time   NA 139 03/12/2019 1056   NA 138 10/26/2016 1032   K 4.1 03/12/2019 1056   K 4.2 10/26/2016 1032   CL 101 03/12/2019 1056   CL 99 10/26/2014 1042   CO2 30 03/12/2019 1056   CO2 28 10/26/2016 1032   BUN 20 03/12/2019 1056   BUN 20.1 10/26/2016 1032   CREATININE 0.77 03/12/2019 1056   CREATININE 0.8 10/26/2016 1032      Component Value Date/Time   CALCIUM 9.8 03/12/2019 1056   CALCIUM 10.2 10/26/2016 1032   ALKPHOS 57 03/12/2019 1056   ALKPHOS 71 10/26/2016 1032   AST 18 03/12/2019 1056   AST 20 10/26/2016 1032   ALT 15 03/12/2019 1056   ALT 19 10/26/2016 1032   BILITOT 0.6 03/12/2019 1056   BILITOT 0.63 10/26/2016 1032      Impression and Plan: Sophia Hernandez is 73 year old white female. She has both a history of breast cancer and lung cancer. Both of these were close to 20 years ago. The breast cancer was 19 years ago. The lung cancer was 26years ago.  Right now, everything looks fantastic.  I do not see any issue with respect to recurrent breast cancer or lung cancer.  I will plan to have her come back in 1 more year.    Volanda Napoleon, MD 6/24/202012:57 PM

## 2019-03-12 NOTE — Telephone Encounter (Signed)
Appointments scheduled letter/calendar mailed per 6/24 los

## 2019-03-31 ENCOUNTER — Other Ambulatory Visit: Payer: Self-pay | Admitting: Hematology & Oncology

## 2019-03-31 DIAGNOSIS — Z1231 Encounter for screening mammogram for malignant neoplasm of breast: Secondary | ICD-10-CM

## 2019-05-13 ENCOUNTER — Ambulatory Visit
Admission: RE | Admit: 2019-05-13 | Discharge: 2019-05-13 | Disposition: A | Discharge status: Home / Self Care | Payer: Medicare Other | Source: Ambulatory Visit | Attending: Hematology & Oncology | Admitting: Hematology & Oncology

## 2019-05-13 ENCOUNTER — Other Ambulatory Visit: Payer: Self-pay

## 2019-05-13 DIAGNOSIS — Z1231 Encounter for screening mammogram for malignant neoplasm of breast: Secondary | ICD-10-CM

## 2020-02-24 DIAGNOSIS — M810 Age-related osteoporosis without current pathological fracture: Secondary | ICD-10-CM | POA: Diagnosis not present

## 2020-02-24 DIAGNOSIS — I10 Essential (primary) hypertension: Secondary | ICD-10-CM | POA: Diagnosis not present

## 2020-02-24 DIAGNOSIS — E785 Hyperlipidemia, unspecified: Secondary | ICD-10-CM | POA: Diagnosis not present

## 2020-03-02 DIAGNOSIS — Z0000 Encounter for general adult medical examination without abnormal findings: Secondary | ICD-10-CM | POA: Diagnosis not present

## 2020-03-02 DIAGNOSIS — M25511 Pain in right shoulder: Secondary | ICD-10-CM | POA: Diagnosis not present

## 2020-03-02 DIAGNOSIS — K219 Gastro-esophageal reflux disease without esophagitis: Secondary | ICD-10-CM | POA: Diagnosis not present

## 2020-03-02 DIAGNOSIS — E785 Hyperlipidemia, unspecified: Secondary | ICD-10-CM | POA: Diagnosis not present

## 2020-03-02 DIAGNOSIS — I10 Essential (primary) hypertension: Secondary | ICD-10-CM | POA: Diagnosis not present

## 2020-03-02 DIAGNOSIS — G8929 Other chronic pain: Secondary | ICD-10-CM | POA: Diagnosis not present

## 2020-03-02 DIAGNOSIS — M810 Age-related osteoporosis without current pathological fracture: Secondary | ICD-10-CM | POA: Diagnosis not present

## 2020-03-03 ENCOUNTER — Other Ambulatory Visit: Payer: Self-pay | Admitting: Hematology & Oncology

## 2020-03-11 ENCOUNTER — Encounter: Payer: Self-pay | Admitting: Hematology & Oncology

## 2020-03-11 ENCOUNTER — Inpatient Hospital Stay (HOSPITAL_BASED_OUTPATIENT_CLINIC_OR_DEPARTMENT_OTHER): Payer: Medicare PPO | Admitting: Hematology & Oncology

## 2020-03-11 ENCOUNTER — Inpatient Hospital Stay: Payer: Medicare PPO | Attending: Hematology & Oncology

## 2020-03-11 ENCOUNTER — Other Ambulatory Visit: Payer: Self-pay

## 2020-03-11 ENCOUNTER — Ambulatory Visit (HOSPITAL_BASED_OUTPATIENT_CLINIC_OR_DEPARTMENT_OTHER)
Admission: RE | Admit: 2020-03-11 | Discharge: 2020-03-11 | Disposition: A | Discharge status: Home / Self Care | Payer: Medicare PPO | Source: Ambulatory Visit | Attending: Hematology & Oncology | Admitting: Hematology & Oncology

## 2020-03-11 VITALS — BP 144/87 | HR 78 | Temp 96.9°F | Resp 18 | Ht 60.24 in | Wt 133.0 lb

## 2020-03-11 DIAGNOSIS — Z853 Personal history of malignant neoplasm of breast: Secondary | ICD-10-CM | POA: Diagnosis not present

## 2020-03-11 DIAGNOSIS — Z85118 Personal history of other malignant neoplasm of bronchus and lung: Secondary | ICD-10-CM | POA: Insufficient documentation

## 2020-03-11 DIAGNOSIS — M25512 Pain in left shoulder: Secondary | ICD-10-CM | POA: Diagnosis not present

## 2020-03-11 DIAGNOSIS — C50919 Malignant neoplasm of unspecified site of unspecified female breast: Secondary | ICD-10-CM | POA: Diagnosis not present

## 2020-03-11 DIAGNOSIS — R0789 Other chest pain: Secondary | ICD-10-CM | POA: Diagnosis not present

## 2020-03-11 LAB — CMP (CANCER CENTER ONLY)
ALT: 17 U/L (ref 0–44)
AST: 18 U/L (ref 15–41)
Albumin: 4.5 g/dL (ref 3.5–5.0)
Alkaline Phosphatase: 55 U/L (ref 38–126)
Anion gap: 9 (ref 5–15)
BUN: 20 mg/dL (ref 8–23)
CO2: 28 mmol/L (ref 22–32)
Calcium: 9.7 mg/dL (ref 8.9–10.3)
Chloride: 102 mmol/L (ref 98–111)
Creatinine: 0.91 mg/dL (ref 0.44–1.00)
GFR, Est AFR Am: >60 mL/min (ref >60)
GFR, Estimated: >60 mL/min (ref >60)
Glucose, Bld: 104 mg/dL — ABNORMAL HIGH (ref 70–99)
Potassium: 3.8 mmol/L (ref 3.5–5.1)
Sodium: 139 mmol/L (ref 135–145)
Total Bilirubin: 0.4 mg/dL (ref 0.3–1.2)
Total Protein: 7.1 g/dL (ref 6.5–8.1)

## 2020-03-11 LAB — CBC WITH DIFFERENTIAL (CANCER CENTER ONLY)
Abs Immature Granulocytes: 0.01 10*3/uL (ref 0.00–0.07)
Basophils Absolute: 0 10*3/uL (ref 0.0–0.1)
Basophils Relative: 0 %
Eosinophils Absolute: 0 10*3/uL (ref 0.0–0.5)
Eosinophils Relative: 0 %
HCT: 38.1 % (ref 36.0–46.0)
Hemoglobin: 12.8 g/dL (ref 12.0–15.0)
Immature Granulocytes: 0 %
Lymphocytes Relative: 37 %
Lymphs Abs: 2 10*3/uL (ref 0.7–4.0)
MCH: 32.8 pg (ref 26.0–34.0)
MCHC: 33.6 g/dL (ref 30.0–36.0)
MCV: 97.7 fL (ref 80.0–100.0)
Monocytes Absolute: 0.3 10*3/uL (ref 0.1–1.0)
Monocytes Relative: 5 %
Neutro Abs: 3.1 10*3/uL (ref 1.7–7.7)
Neutrophils Relative %: 58 %
Platelet Count: 212 10*3/uL (ref 150–400)
RBC: 3.9 MIL/uL (ref 3.87–5.11)
RDW: 12.1 % (ref 11.5–15.5)
WBC Count: 5.4 10*3/uL (ref 4.0–10.5)
nRBC: 0 % (ref 0.0–0.2)

## 2020-03-11 NOTE — Progress Notes (Signed)
Hematology and Oncology Follow Up Visit  Sophia Hernandez [id number] 1946/04/02 74 y.o. 03/11/2020   Principle Diagnosis:  Stage III (T3 N2 M0) ductal carcinoma of the right breast --     remission x16 years.  History of stage I (T1 N0 M0) non-small cell lung cancer of the     right lung -- remission x23 years.  Current Therapy:    Observation     Interim History:  Ms.  Hernandez is back for follow-up. We see her yearly.  She has done well to avoid the coronavirus.  She has been vaccinated.  Her only complaint has been pain in the left collarbone and lower neck area.  She has had this for a few weeks.  She says it just feels very stiff.  There is no trauma.  She has had no fever.  She has had no cough.  Is been no change in bowel or bladder habits.  Her brother is still on the recovering from the stroke that he had.  She has had no leg swelling.  She has had no rashes.  Overall, her performance status is ECOG 1.    Medications:  Current Outpatient Medications:  .  aspirin 81 MG tablet, Take 81 mg by mouth daily., Disp: , Rfl:  .  Cholecalciferol (VITAMIN D) 125 MCG (5000 UT) CAPS, Take 5,000 Int'l Units by mouth daily., Disp: , Rfl:  .  KLOR-CON M20 20 MEQ tablet, Take by mouth daily. , Disp: , Rfl:  .  losartan-hydrochlorothiazide (HYZAAR) 100-12.5 MG per tablet, Take 1 tablet by mouth daily., Disp: , Rfl:  .  Multiple Vitamins-Minerals (MULTIVITAMIN PO), Take by mouth every morning., Disp: , Rfl:  .  Omega-3 1000 MG CAPS, Take by mouth 3 (three) times a week., Disp: , Rfl:  .  Omeprazole 20 MG TBEC, Take by mouth every 3 (three) days. , Disp: , Rfl:  .  simvastatin (ZOCOR) 20 MG tablet, Take 20 mg by mouth at bedtime. , Disp: , Rfl:  .  vitamin B-12 (CYANOCOBALAMIN) 100 MCG tablet, Take 100 mcg by mouth daily., Disp: , Rfl:   Allergies:  Allergies  Allergen Reactions  . Sulfa Antibiotics     rash  . Penicillins Rash    Past Medical History, Surgical history,  Social history, and Family History were reviewed and updated.  Review of Systems: Review of Systems  Musculoskeletal: Positive for back pain and joint pain.  All other systems reviewed and are negative.   Physical Exam:  height is 5' 0.24" (1.53 m) and weight is 133 lb (60.3 kg). Her temporal temperature is 96.9 F (36.1 C) (abnormal). Her blood pressure is 144/87 (abnormal) and her pulse is 78. Her respiration is 18 and oxygen saturation is 100%.   Physical Exam Vitals reviewed.  Constitutional:      Comments: Breast exam shows left breast no masses, edema or erythema.  There is no left axillary adenopathy.  Right chest wall shows well-healed mastectomy.  There is no nodules or erythema along the mastectomy site.  There is no right axillary adenopathy.  HENT:     Head: Normocephalic and atraumatic.  Eyes:     Pupils: Pupils are equal, round, and reactive to light.  Cardiovascular:     Rate and Rhythm: Normal rate and regular rhythm.     Heart sounds: Normal heart sounds.  Pulmonary:     Effort: Pulmonary effort is normal.     Breath sounds: Normal breath sounds.  Abdominal:  General: Bowel sounds are normal.     Palpations: Abdomen is soft.  Musculoskeletal:        General: No tenderness or deformity. Normal range of motion.     Cervical back: Normal range of motion.  Lymphadenopathy:     Cervical: No cervical adenopathy.  Skin:    General: Skin is warm and dry.     Findings: No erythema or rash.  Neurological:     Mental Status: She is alert and oriented to person, place, and time.  Psychiatric:        Behavior: Behavior normal.        Thought Content: Thought content normal.        Judgment: Judgment normal.      Lab Results  Component Value Date   WBC 5.4 03/11/2020   HGB 12.8 03/11/2020   HCT 38.1 03/11/2020   MCV 97.7 03/11/2020   PLT 212 03/11/2020     Chemistry      Component Value Date/Time   NA 139 03/11/2020 1138   NA 138 10/26/2016 1032   K  3.8 03/11/2020 1138   K 4.2 10/26/2016 1032   CL 102 03/11/2020 1138   CL 99 10/26/2014 1042   CO2 28 03/11/2020 1138   CO2 28 10/26/2016 1032   BUN 20 03/11/2020 1138   BUN 20.1 10/26/2016 1032   CREATININE 0.91 03/11/2020 1138   CREATININE 0.8 10/26/2016 1032      Component Value Date/Time   CALCIUM 9.7 03/11/2020 1138   CALCIUM 10.2 10/26/2016 1032   ALKPHOS 55 03/11/2020 1138   ALKPHOS 71 10/26/2016 1032   AST 18 03/11/2020 1138   AST 20 10/26/2016 1032   ALT 17 03/11/2020 1138   ALT 19 10/26/2016 1032   BILITOT 0.4 03/11/2020 1138   BILITOT 0.63 10/26/2016 1032      Impression and Plan: Sophia Hernandez is 74 year old white female. She has both a history of breast cancer and lung cancer. Both of these were close to 21 years ago. The breast cancer was 19 years ago. The lung cancer was 27 years ago.  Right now, we will go ahead and get just get some plain films.  How the this is all that we need.  I cannot imagine that she would have any problems with recurrent disease since it is been over 20 years for both malignancies.  I will plan to have her come back in 1 more year.    Volanda Napoleon, MD 6/24/202112:37 PM

## 2020-03-15 ENCOUNTER — Telehealth: Payer: Self-pay | Admitting: *Deleted

## 2020-03-15 NOTE — Telephone Encounter (Addendum)
-  Called patient to let her know that  the shoulder x-ray also looks fine.  There may be a little bit of arthritis but nothing that looks like cancer or tears. The Chest Xray also was normal.  Patient appreciative of information.

## 2020-03-24 ENCOUNTER — Other Ambulatory Visit: Payer: Self-pay | Admitting: Hematology & Oncology

## 2020-03-24 DIAGNOSIS — Z1231 Encounter for screening mammogram for malignant neoplasm of breast: Secondary | ICD-10-CM

## 2020-03-30 DIAGNOSIS — M1612 Unilateral primary osteoarthritis, left hip: Secondary | ICD-10-CM | POA: Diagnosis not present

## 2020-03-30 DIAGNOSIS — M25552 Pain in left hip: Secondary | ICD-10-CM | POA: Diagnosis not present

## 2020-04-07 DIAGNOSIS — M25552 Pain in left hip: Secondary | ICD-10-CM | POA: Diagnosis not present

## 2020-04-20 DIAGNOSIS — M1612 Unilateral primary osteoarthritis, left hip: Secondary | ICD-10-CM | POA: Diagnosis not present

## 2020-04-20 DIAGNOSIS — M7062 Trochanteric bursitis, left hip: Secondary | ICD-10-CM | POA: Diagnosis not present

## 2020-04-27 DIAGNOSIS — M25552 Pain in left hip: Secondary | ICD-10-CM | POA: Diagnosis not present

## 2020-04-27 DIAGNOSIS — M1612 Unilateral primary osteoarthritis, left hip: Secondary | ICD-10-CM | POA: Diagnosis not present

## 2020-04-27 DIAGNOSIS — M7062 Trochanteric bursitis, left hip: Secondary | ICD-10-CM | POA: Diagnosis not present

## 2020-04-28 DIAGNOSIS — M25552 Pain in left hip: Secondary | ICD-10-CM | POA: Diagnosis not present

## 2020-04-28 DIAGNOSIS — M7062 Trochanteric bursitis, left hip: Secondary | ICD-10-CM | POA: Diagnosis not present

## 2020-04-28 DIAGNOSIS — M1612 Unilateral primary osteoarthritis, left hip: Secondary | ICD-10-CM | POA: Diagnosis not present

## 2020-05-03 DIAGNOSIS — M1612 Unilateral primary osteoarthritis, left hip: Secondary | ICD-10-CM | POA: Diagnosis not present

## 2020-05-03 DIAGNOSIS — M25552 Pain in left hip: Secondary | ICD-10-CM | POA: Diagnosis not present

## 2020-05-03 DIAGNOSIS — M7062 Trochanteric bursitis, left hip: Secondary | ICD-10-CM | POA: Diagnosis not present

## 2020-05-05 DIAGNOSIS — M25552 Pain in left hip: Secondary | ICD-10-CM | POA: Diagnosis not present

## 2020-05-05 DIAGNOSIS — M1612 Unilateral primary osteoarthritis, left hip: Secondary | ICD-10-CM | POA: Diagnosis not present

## 2020-05-05 DIAGNOSIS — M7062 Trochanteric bursitis, left hip: Secondary | ICD-10-CM | POA: Diagnosis not present

## 2020-05-13 ENCOUNTER — Other Ambulatory Visit: Payer: Self-pay

## 2020-05-13 ENCOUNTER — Ambulatory Visit
Admission: RE | Admit: 2020-05-13 | Discharge: 2020-05-13 | Disposition: A | Discharge status: Home / Self Care | Payer: Medicare PPO | Source: Ambulatory Visit | Attending: Hematology & Oncology | Admitting: Hematology & Oncology

## 2020-05-13 DIAGNOSIS — Z1231 Encounter for screening mammogram for malignant neoplasm of breast: Secondary | ICD-10-CM

## 2020-05-27 ENCOUNTER — Inpatient Hospital Stay (HOSPITAL_COMMUNITY): Payer: Medicare PPO | Admitting: Anesthesiology

## 2020-05-27 ENCOUNTER — Ambulatory Visit (HOSPITAL_COMMUNITY)
Admission: RE | Admit: 2020-05-27 | Discharge: 2020-05-27 | Disposition: A | Discharge status: Home / Self Care | Payer: Medicare PPO | Source: Other Acute Inpatient Hospital | Attending: Ophthalmology | Admitting: Ophthalmology

## 2020-05-27 ENCOUNTER — Encounter (HOSPITAL_COMMUNITY): Payer: Self-pay | Admitting: Ophthalmology

## 2020-05-27 ENCOUNTER — Other Ambulatory Visit: Payer: Self-pay

## 2020-05-27 ENCOUNTER — Encounter (HOSPITAL_COMMUNITY): Admission: RE | Disposition: A | Discharge status: Home / Self Care | Payer: Self-pay | Attending: Ophthalmology

## 2020-05-27 DIAGNOSIS — I10 Essential (primary) hypertension: Secondary | ICD-10-CM | POA: Diagnosis not present

## 2020-05-27 DIAGNOSIS — H53131 Sudden visual loss, right eye: Secondary | ICD-10-CM | POA: Diagnosis not present

## 2020-05-27 DIAGNOSIS — H5203 Hypermetropia, bilateral: Secondary | ICD-10-CM | POA: Diagnosis not present

## 2020-05-27 DIAGNOSIS — Z20822 Contact with and (suspected) exposure to covid-19: Secondary | ICD-10-CM | POA: Insufficient documentation

## 2020-05-27 DIAGNOSIS — H33101 Unspecified retinoschisis, right eye: Secondary | ICD-10-CM | POA: Diagnosis not present

## 2020-05-27 DIAGNOSIS — H33001 Unspecified retinal detachment with retinal break, right eye: Secondary | ICD-10-CM | POA: Diagnosis not present

## 2020-05-27 DIAGNOSIS — H33011 Retinal detachment with single break, right eye: Secondary | ICD-10-CM | POA: Diagnosis not present

## 2020-05-27 DIAGNOSIS — H3321 Serous retinal detachment, right eye: Secondary | ICD-10-CM | POA: Diagnosis not present

## 2020-05-27 DIAGNOSIS — H348312 Tributary (branch) retinal vein occlusion, right eye, stable: Secondary | ICD-10-CM | POA: Diagnosis not present

## 2020-05-27 DIAGNOSIS — H33021 Retinal detachment with multiple breaks, right eye: Secondary | ICD-10-CM | POA: Diagnosis not present

## 2020-05-27 DIAGNOSIS — H53451 Other localized visual field defect, right eye: Secondary | ICD-10-CM | POA: Diagnosis not present

## 2020-05-27 DIAGNOSIS — H2513 Age-related nuclear cataract, bilateral: Secondary | ICD-10-CM | POA: Diagnosis not present

## 2020-05-27 DIAGNOSIS — H25813 Combined forms of age-related cataract, bilateral: Secondary | ICD-10-CM | POA: Diagnosis not present

## 2020-05-27 DIAGNOSIS — Z853 Personal history of malignant neoplasm of breast: Secondary | ICD-10-CM | POA: Diagnosis not present

## 2020-05-27 DIAGNOSIS — H43391 Other vitreous opacities, right eye: Secondary | ICD-10-CM | POA: Diagnosis not present

## 2020-05-27 DIAGNOSIS — K219 Gastro-esophageal reflux disease without esophagitis: Secondary | ICD-10-CM | POA: Diagnosis not present

## 2020-05-27 HISTORY — PX: PARS PLANA VITRECTOMY: SHX2166

## 2020-05-27 HISTORY — PX: REPAIR OF COMPLEX TRACTION RETINAL DETACHMENT: SHX6217

## 2020-05-27 HISTORY — PX: GAS/FLUID EXCHANGE: SHX5334

## 2020-05-27 HISTORY — DX: Essential (primary) hypertension: I10

## 2020-05-27 HISTORY — DX: Malignant (primary) neoplasm, unspecified: C80.1

## 2020-05-27 HISTORY — PX: GAS INSERTION: SHX5336

## 2020-05-27 HISTORY — PX: PHOTOCOAGULATION WITH LASER: SHX6027

## 2020-05-27 LAB — SARS CORONAVIRUS 2 BY RT PCR (HOSPITAL ORDER, PERFORMED IN ~~LOC~~ HOSPITAL LAB): SARS Coronavirus 2: NEGATIVE

## 2020-05-27 SURGERY — PARS PLANA VITRECTOMY WITH 25 GAUGE
Anesthesia: Monitor Anesthesia Care | Site: Eye | Laterality: Right

## 2020-05-27 MED ORDER — PROPARACAINE HCL 0.5 % OP SOLN
OPHTHALMIC | Status: AC
  Administered 2020-05-27: 1 [drp] via OPHTHALMIC
  Filled 2020-05-27: qty 15

## 2020-05-27 MED ORDER — ONDANSETRON HCL 4 MG/2ML IJ SOLN
INTRAMUSCULAR | Status: AC
  Filled 2020-05-27: qty 2

## 2020-05-27 MED ORDER — TOBRAMYCIN-DEXAMETHASONE 0.3-0.1 % OP OINT
TOPICAL_OINTMENT | OPHTHALMIC | Status: AC
  Filled 2020-05-27: qty 3.5

## 2020-05-27 MED ORDER — ACETAMINOPHEN 10 MG/ML IV SOLN: 1000.0000 mg | Freq: Once | INTRAVENOUS | Status: DC | PRN

## 2020-05-27 MED ORDER — PHENYLEPHRINE HCL 2.5 % OP SOLN
OPHTHALMIC | Status: AC
  Administered 2020-05-27: 1 [drp] via OPHTHALMIC
  Filled 2020-05-27: qty 2

## 2020-05-27 MED ORDER — EPINEPHRINE PF 1 MG/ML IJ SOLN: INTRAOCULAR | Status: DC | PRN

## 2020-05-27 MED ORDER — VANCOMYCIN SUBCONJUNCTIVAL INJECTION 25 MG/0.5 ML
INTRAOCULAR | Status: DC | PRN
  Administered 2020-05-27: 25 mg via SUBCONJUNCTIVAL

## 2020-05-27 MED ORDER — PROPARACAINE HCL 0.5 % OP SOLN: 1.0000 [drp] | OPHTHALMIC | Status: DC | PRN

## 2020-05-27 MED ORDER — TROPICAMIDE 1 % OP SOLN: 1.0000 [drp] | OPHTHALMIC | Status: DC | PRN

## 2020-05-27 MED ORDER — DEXAMETHASONE SODIUM PHOSPHATE 10 MG/ML IJ SOLN
INTRAMUSCULAR | Status: DC | PRN
  Administered 2020-05-27: 10 mg

## 2020-05-27 MED ORDER — OFLOXACIN 0.3 % OP SOLN
OPHTHALMIC | Status: AC
  Administered 2020-05-27: 1 [drp] via OPHTHALMIC
  Filled 2020-05-27: qty 5

## 2020-05-27 MED ORDER — CHLORHEXIDINE GLUCONATE 0.12 % MT SOLN: 15.0000 mL | OROMUCOSAL | Status: DC

## 2020-05-27 MED ORDER — PROPOFOL 10 MG/ML IV BOLUS
INTRAVENOUS | Status: DC | PRN
  Administered 2020-05-27 (×2): 20 mg via INTRAVENOUS

## 2020-05-27 MED ORDER — FENTANYL CITRATE (PF) 100 MCG/2ML IJ SOLN
INTRAMUSCULAR | Status: DC | PRN
  Administered 2020-05-27: 25 ug via INTRAVENOUS

## 2020-05-27 MED ORDER — LIDOCAINE HCL 2 % IJ SOLN
INTRAMUSCULAR | Status: AC
  Filled 2020-05-27: qty 20

## 2020-05-27 MED ORDER — CYCLOPENTOLATE HCL 1 % OP SOLN: 1.0000 [drp] | OPHTHALMIC | Status: DC | PRN

## 2020-05-27 MED ORDER — BSS IO SOLN
INTRAOCULAR | Status: DC | PRN
  Administered 2020-05-27: 15 mL via INTRAOCULAR

## 2020-05-27 MED ORDER — BSS PLUS IO SOLN: INTRAOCULAR | Status: DC | PRN

## 2020-05-27 MED ORDER — FENTANYL CITRATE (PF) 250 MCG/5ML IJ SOLN
INTRAMUSCULAR | Status: AC
  Filled 2020-05-27: qty 5

## 2020-05-27 MED ORDER — LIDOCAINE HCL 2 % IJ SOLN: INTRAMUSCULAR | Status: DC | PRN

## 2020-05-27 MED ORDER — DEXAMETHASONE SODIUM PHOSPHATE 10 MG/ML IJ SOLN
INTRAMUSCULAR | Status: AC
  Filled 2020-05-27: qty 1

## 2020-05-27 MED ORDER — PHENYLEPHRINE HCL 2.5 % OP SOLN: 1.0000 [drp] | OPHTHALMIC | Status: DC | PRN

## 2020-05-27 MED ORDER — HYALURONIDASE HUMAN 150 UNIT/ML IJ SOLN
INTRAMUSCULAR | Status: AC
  Filled 2020-05-27: qty 1

## 2020-05-27 MED ORDER — ONDANSETRON HCL 4 MG/2ML IJ SOLN: 4.0000 mg | Freq: Once | INTRAMUSCULAR | Status: DC | PRN

## 2020-05-27 MED ORDER — INDOCYANINE GREEN 25 MG IV SOLR
INTRAVENOUS | Status: AC
  Filled 2020-05-27: qty 10

## 2020-05-27 MED ORDER — SODIUM CHLORIDE 0.9 % IV SOLN: INTRAVENOUS | Status: DC

## 2020-05-27 MED ORDER — HYPROMELLOSE (GONIOSCOPIC) 2.5 % OP SOLN
OPHTHALMIC | Status: DC | PRN
  Administered 2020-05-27: 1 [drp]

## 2020-05-27 MED ORDER — BSS PLUS IO SOLN
INTRAOCULAR | Status: AC
  Filled 2020-05-27: qty 500

## 2020-05-27 MED ORDER — VANCOMYCIN SUBCONJUNCTIVAL INJECTION 25 MG/0.5 ML
25.0000 mg | INTRAOCULAR | Status: DC
  Filled 2020-05-27: qty 0.5

## 2020-05-27 MED ORDER — EPINEPHRINE PF 1 MG/ML IJ SOLN
INTRAMUSCULAR | Status: AC
  Filled 2020-05-27: qty 1

## 2020-05-27 MED ORDER — TROPICAMIDE 1 % OP SOLN
OPHTHALMIC | Status: AC
  Administered 2020-05-27: 1 [drp] via OPHTHALMIC
  Filled 2020-05-27: qty 15

## 2020-05-27 MED ORDER — FENTANYL CITRATE (PF) 100 MCG/2ML IJ SOLN: 25.0000 ug | INTRAMUSCULAR | Status: DC | PRN

## 2020-05-27 MED ORDER — HYPROMELLOSE (GONIOSCOPIC) 2.5 % OP SOLN
OPHTHALMIC | Status: AC
  Filled 2020-05-27: qty 15

## 2020-05-27 MED ORDER — SODIUM CHLORIDE 0.9 % IV SOLN: INTRAVENOUS | Status: DC | PRN

## 2020-05-27 MED ORDER — TOBRAMYCIN-DEXAMETHASONE 0.3-0.1 % OP OINT
TOPICAL_OINTMENT | OPHTHALMIC | Status: DC | PRN
  Administered 2020-05-27: 1 via OPHTHALMIC

## 2020-05-27 MED ORDER — BSS IO SOLN
INTRAOCULAR | Status: AC
  Filled 2020-05-27: qty 15

## 2020-05-27 MED ORDER — ATROPINE SULFATE 1 % OP SOLN
OPHTHALMIC | Status: AC
  Filled 2020-05-27: qty 5

## 2020-05-27 MED ORDER — OFLOXACIN 0.3 % OP SOLN: 1.0000 [drp] | OPHTHALMIC | Status: DC | PRN

## 2020-05-27 MED ORDER — BUPIVACAINE HCL (PF) 0.75 % IJ SOLN
INTRAMUSCULAR | Status: AC
  Filled 2020-05-27: qty 10

## 2020-05-27 SURGICAL SUPPLY — 70 items
APL SWBSTK 6 STRL LF DISP (MISCELLANEOUS) ×1
APPLICATOR COTTON TIP 6 STRL (MISCELLANEOUS) ×1 IMPLANT
APPLICATOR COTTON TIP 6IN STRL (MISCELLANEOUS) ×2
BAND WRIST GAS GREEN (MISCELLANEOUS) IMPLANT
BLADE MVR KNIFE 20G (BLADE) IMPLANT
BLADE STAB KNIFE 15DEG (BLADE) IMPLANT
CABLE BIPOLOR RESECTION CORD (MISCELLANEOUS) ×1 IMPLANT
CANNULA ANT CHAM MAIN (OPHTHALMIC RELATED) ×1 IMPLANT
CANNULA DUAL BORE 23G (CANNULA) IMPLANT
CANNULA DUALBORE 25G (CANNULA) ×1 IMPLANT
CANNULA VLV SOFT TIP 25G (OPHTHALMIC) ×1 IMPLANT
CANNULA VLV SOFT TIP 25GA (OPHTHALMIC) ×2 IMPLANT
CAUTERY EYE LOW TEMP 1300F FIN (OPHTHALMIC RELATED) IMPLANT
CLSR STERI-STRIP ANTIMIC 1/2X4 (GAUZE/BANDAGES/DRESSINGS) ×2 IMPLANT
COVER MAYO STAND STRL (DRAPES) IMPLANT
DRAPE HALF SHEET 40X57 (DRAPES) ×2 IMPLANT
DRAPE INCISE 51X51 W/FILM STRL (DRAPES) IMPLANT
DRAPE RETRACTOR (MISCELLANEOUS) ×2 IMPLANT
ERASER HMR WETFIELD 23G BP (MISCELLANEOUS) IMPLANT
FORCEPS ECKARDT ILM 25G SERR (OPHTHALMIC RELATED) IMPLANT
FORCEPS GRIESHABER ILM 25G A (INSTRUMENTS) IMPLANT
GAS AUTO FILL CONSTEL (OPHTHALMIC) ×2
GAS AUTO FILL CONSTELLATION (OPHTHALMIC) IMPLANT
GAS IO PFP 125GM ISPAN CNSTL (MISCELLANEOUS) IMPLANT
GAS TANK CONSTELL (MISCELLANEOUS) ×2
GAS WRIST BAND GREEN (MISCELLANEOUS)
GLOVE SURG SYN 7.5  E (GLOVE) ×2
GLOVE SURG SYN 7.5 E (GLOVE) ×1 IMPLANT
GLOVE SURG SYN 7.5 PF PI (GLOVE) ×1 IMPLANT
GOWN STRL REUS W/ TWL LRG LVL3 (GOWN DISPOSABLE) ×1 IMPLANT
GOWN STRL REUS W/TWL LRG LVL3 (GOWN DISPOSABLE) ×2
KIT BASIN OR (CUSTOM PROCEDURE TRAY) ×2 IMPLANT
KIT PERFLUORON PROCEDURE 5ML (MISCELLANEOUS) ×1 IMPLANT
KIT TURNOVER KIT B (KITS) ×2 IMPLANT
LENS BIOM SUPER VIEW SET DISP (MISCELLANEOUS) ×2 IMPLANT
MICROPICK 25G (MISCELLANEOUS)
NDL 18GX1X1/2 (RX/OR ONLY) (NEEDLE) ×1 IMPLANT
NDL 25GX 5/8IN NON SAFETY (NEEDLE) ×1 IMPLANT
NDL FILTER BLUNT 18X1 1/2 (NEEDLE) ×1 IMPLANT
NDL HYPO 25GX1X1/2 BEV (NEEDLE) IMPLANT
NDL HYPO 30X.5 LL (NEEDLE) ×2 IMPLANT
NDL RETROBULBAR 25GX1.5 (NEEDLE) ×1 IMPLANT
NEEDLE 18GX1X1/2 (RX/OR ONLY) (NEEDLE) ×2 IMPLANT
NEEDLE 25GX 5/8IN NON SAFETY (NEEDLE) ×2 IMPLANT
NEEDLE FILTER BLUNT 18X 1/2SAF (NEEDLE) ×1
NEEDLE FILTER BLUNT 18X1 1/2 (NEEDLE) ×1 IMPLANT
NEEDLE HYPO 25GX1X1/2 BEV (NEEDLE) IMPLANT
NEEDLE HYPO 30X.5 LL (NEEDLE) ×4 IMPLANT
NEEDLE RETROBULBAR 25GX1.5 (NEEDLE) ×2 IMPLANT
NS IRRIG 1000ML POUR BTL (IV SOLUTION) ×2 IMPLANT
PACK FRAGMATOME (OPHTHALMIC) IMPLANT
PACK VITRECTOMY CUSTOM (CUSTOM PROCEDURE TRAY) ×2 IMPLANT
PAD ARMBOARD 7.5X6 YLW CONV (MISCELLANEOUS) ×4 IMPLANT
PAK PIK VITRECTOMY CVS 25GA (OPHTHALMIC) ×2 IMPLANT
PICK MICROPICK 25G (MISCELLANEOUS) IMPLANT
PROBE ENDO DIATHERMY 25G (MISCELLANEOUS) ×2 IMPLANT
PROBE LASER ILLUM FLEX CVD 25G (OPHTHALMIC) ×1 IMPLANT
ROLLS DENTAL (MISCELLANEOUS) IMPLANT
SCRAPER DIAMOND 25GA (OPHTHALMIC RELATED) IMPLANT
SOL ANTI FOG 6CC (MISCELLANEOUS) ×1 IMPLANT
SOLUTION ANTI FOG 6CC (MISCELLANEOUS) ×1
STOPCOCK 4 WAY LG BORE MALE ST (IV SETS) IMPLANT
SUT VICRYL 7 0 TG140 8 (SUTURE) ×1 IMPLANT
SUT VICRYL 8 0 TG140 8 (SUTURE) IMPLANT
SYR 10ML LL (SYRINGE) IMPLANT
SYR 20ML LL LF (SYRINGE) ×2 IMPLANT
SYR 5ML LL (SYRINGE) ×2 IMPLANT
SYR TB 1ML LUER SLIP (SYRINGE) IMPLANT
WATER STERILE IRR 1000ML POUR (IV SOLUTION) ×2 IMPLANT
WIPE INSTRUMENT VISIWIPE 73X73 (MISCELLANEOUS) IMPLANT

## 2020-05-27 NOTE — Discharge Instructions (Addendum)
DO NOT SLEEP ON BACK, THE EYE PRESSURE CAN GO UP AND CAUSE VISION LOSS   SLEEP ON SIDE WITH NOSE TO PILLOW  DURING DAY KEEP FACE DOWN.  15 MINUTES EVERY 2 HOURS IT IS OK TO LOOK STRAIGHT AHEAD (USE BATHROOM, EAT, WALK, ETC.)

## 2020-05-27 NOTE — Anesthesia Postprocedure Evaluation (Signed)
Anesthesia Post Note  Patient: Samina Weekes  Procedure(s) Performed: PARS PLANA VITRECTOMY WITH 25 GAUGE (Right Eye) REPAIR OF COMPLEX TRACTION RETINAL DETACHMENT (Right Eye) PHOTOCOAGULATION WITH LASER (Right Eye) GAS/FLUID EXCHANGE / DRAINAGE OF SUBRETINAL FLUID (Right Eye) INSERTION OF GAS (Right Eye)     Patient location during evaluation: PACU Anesthesia Type: MAC Level of consciousness: awake and alert and awake Pain management: pain level controlled Vital Signs Assessment: post-procedure vital signs reviewed and stable Respiratory status: spontaneous breathing, nonlabored ventilation, respiratory function stable and patient connected to nasal cannula oxygen Cardiovascular status: stable and blood pressure returned to baseline Postop Assessment: no apparent nausea or vomiting Anesthetic complications: no   No complications documented.  Last Vitals:  Vitals:   05/27/20 1805 05/27/20 1820  BP: (!) 144/92 (!) 154/88  Pulse: 81 80  Resp: 14 20  Temp: 36.5 C 36.5 C  SpO2: 99% 99%    Last Pain:  Vitals:   05/27/20 1820  TempSrc:   PainSc: 0-No pain                 Catalina Gravel

## 2020-05-27 NOTE — Anesthesia Preprocedure Evaluation (Addendum)
Anesthesia Evaluation  Patient identified by MRN, date of birth, ID band Patient awake    Reviewed: Allergy & Precautions, NPO status , Patient's Chart, lab work & pertinent test results  History of Anesthesia Complications Negative for: history of anesthetic complications  Airway Mallampati: III  TM Distance: >3 FB Neck ROM: Full    Dental no notable dental hx.    Pulmonary neg pulmonary ROS,  S/p right upper lobectomy   Pulmonary exam normal breath sounds clear to auscultation       Cardiovascular hypertension, Pt. on medications Normal cardiovascular exam Rhythm:Regular Rate:Normal     Neuro/Psych negative neurological ROS  negative psych ROS   GI/Hepatic Neg liver ROS, GERD  Medicated and Controlled,  Endo/Other  negative endocrine ROS  Renal/GU negative Renal ROS     Musculoskeletal negative musculoskeletal ROS (+)   Abdominal   Peds  Hematology HLD   Anesthesia Other Findings Right eye retinal detachment.  Reproductive/Obstetrics                            Anesthesia Physical Anesthesia Plan  ASA: II  Anesthesia Plan: MAC   Post-op Pain Management:    Induction: Intravenous  PONV Risk Score and Plan: 3 and Treatment may vary due to age or medical condition, Ondansetron, Dexamethasone and Propofol infusion  Airway Management Planned: Nasal Cannula  Additional Equipment:   Intra-op Plan:   Post-operative Plan:   Informed Consent: I have reviewed the patients History and Physical, chart, labs and discussed the procedure including the risks, benefits and alternatives for the proposed anesthesia with the patient or authorized representative who has indicated his/her understanding and acceptance.     Dental advisory given  Plan Discussed with: CRNA  Anesthesia Plan Comments:        Anesthesia Quick Evaluation

## 2020-05-27 NOTE — Brief Op Note (Signed)
05/27/2020  6:03 PM  PATIENT:  Sophia Hernandez  74 y.o. female  PRE-OPERATIVE DIAGNOSIS:  Right eye retinal detachment.  POST-OPERATIVE DIAGNOSIS:  Right eye retinal detachment.  PROCEDURE:  Procedure(s): PARS PLANA VITRECTOMY WITH 25 GAUGE (Right) REPAIR OF RETINAL DETACHMENT (Right) Perflouron (Right) PHOTOCOAGULATION WITH LASER (Right) GAS/FLUID EXCHANGE / DRAINAGE OF SUBRETINAL FLUID (Right) INSERTION OF SF6 GAS (Right)  SURGEON:  Surgeon(s) and Role:    * Jalene Mullet, MD - Primary  PHYSICIAN ASSISTANT:   ASSISTANTS: none   ANESTHESIA:   local and MAC  EBL:  minimal   BLOOD ADMINISTERED:none  DRAINS: none   LOCAL MEDICATIONS USED:  MARCAINE    and LIDOCAINE   SPECIMEN:  No Specimen  DISPOSITION OF SPECIMEN:  NA  COUNTS:  YES  TOURNIQUET:  * No tourniquets in log *  DICTATION: .Note written in EPIC  PLAN OF CARE: Discharge to home after PACU  PATIENT DISPOSITION:  PACU - hemodynamically stable.   Delay start of Pharmacological VTE agent (>24hrs) due to surgical blood loss or risk of bleeding: not applicable

## 2020-05-27 NOTE — H&P (Signed)
Date of examination:  05/27/20  Indication for surgery: retinal detachment right eye  Pertinent past medical history:  Past Medical History:  Diagnosis Date  . Cancer ([place])   . Hypertension     Pertinent ocular history:  floaters  Pertinent family history: History reviewed. No pertinent family history.  General:  Healthy appearing patient in no distress.    Eyes:    Acuity OD 20/25  OS 20/20  cc  External: Within normal limits      Anterior segment: Within normal limits        Fundus: Macula-on retinal detachment with break at 8 o'clock and extending from 10 to 4 o'clock    Impression: Retinal detachment right eye  Plan:  Retinal detachment repair right eye   Jalene Mullet, MD

## 2020-05-27 NOTE — Anesthesia Procedure Notes (Signed)
Procedure Name: MAC Date/Time: 05/27/2020 4:39 PM Performed by: Oletta Lamas, CRNA Pre-anesthesia Checklist: Patient identified, Emergency Drugs available, Suction available, Patient being monitored and Timeout performed Patient Re-evaluated:Patient Re-evaluated prior to induction Oxygen Delivery Method: Nasal cannula

## 2020-05-27 NOTE — Transfer of Care (Signed)
Immediate Anesthesia Transfer of Care Note  Patient: Sophia Hernandez  Procedure(s) Performed: PARS PLANA VITRECTOMY WITH 25 GAUGE (Right Eye) REPAIR OF COMPLEX TRACTION RETINAL DETACHMENT (Right Eye) PHOTOCOAGULATION WITH LASER (Right Eye) GAS/FLUID EXCHANGE / DRAINAGE OF SUBRETINAL FLUID (Right Eye) INSERTION OF GAS (Right Eye)  Patient Location: PACU  Anesthesia Type:MAC  Level of Consciousness: awake, alert  and oriented  Airway & Oxygen Therapy: Patient Spontanous Breathing  Post-op Assessment: Report given to RN and Post -op Vital signs reviewed and stable  Post vital signs: Reviewed and stable  Last Vitals:  Vitals Value Taken Time  BP    Temp    Pulse 86 05/27/20 1806  Resp 16 05/27/20 1806  SpO2 98 % 05/27/20 1806  Vitals shown include unvalidated device data.  Last Pain:  Vitals:   05/27/20 1521  TempSrc:   PainSc: 0-No pain      Patients Stated Pain Goal: 0 (49/20/10 0712)  Complications: No complications documented.

## 2020-05-27 NOTE — Op Note (Signed)
Shona Pardo [facility] 05/27/2020 Diagnosis: Retinal detachment right eye  Procedure: Pars Plana Vitrectomy, Endolaser, Fluid Gas Exchange and perflouron and drainage of subretinal fluid right eye Operative Eye:  right eye  Surgeon: Royston Cowper Estimated Blood Loss: minimal Specimens for Pathology:  None Complications: none   The  patient was prepped and draped in the usual fashion for ocular surgery on the  right eye .  A lid speculum was placed.  Infusion line and trocar was placed at the 8 o'clock position approximately 3.5 mm from the surgical limbus.   The infusion line was allowed to run and then clamped when placed at the cannula opening. The line was inserted and secured to the drape with an adhesive strip.   Active trocars/cannula were placed at the 10 and 2 o'clock positions approximately 3.5 mm from the surgical limbus. The cannula was visualized in the vitreous cavity.  The light pipe and vitreous cutter were inserted into the vitreous cavity and a core vitrectomy was performed.   Care taken to remove the vitreous up to the vitreous base for 360 degrees.   Peflouron was added to flatten the macula.  Care was taken to limit mobilization of the retina.  The vitreous was carefully shaved from 4 o'clock to 11 o'clock in the area of the retinal detachment.  Two retinal breaks were noted with a horseshoe tear at 9:30 and round hole at 7 o'clock.  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  This detachment also had a component of very atrophic retina from the retinoschisis.  Endocautery was placed along the posterior edge of the detachment in the inferonasal quadrant.  The silicone soflt-tipped cannula was used the drain the subretinal fluid.  A complete air-fluid exchange was performed.  The remaining subretinal fluid was drained through the retinotomy.  Endolaser was applied 360 degrees to the periphery and additional laser was applied from 6 o'lclock to 10 o'clock to support the retina.  14%  C3F8 gas was placed in the eye.  The superior cannulas were sequentially removed with concommitant tamponade using a cotton tipped applicator and noted to be air tight.  The infusion line and trocar were removed and the sclerotomy was noted to be air tight with normal intraocular pressure by digital palpapation.  Subconjunctival injections of Vancomycin and  Dexamethasone were placed in the infero-medial quadrant.   The speculum and drapes were removed and the eye was patched with Polymixin/Bacitracin ophthalmic ointment. An eye shield was placed and the patient was transferred alert and conversant with stable vital signs to the post operative recovery area.  The patient tolerated the procedure well and no complications were noted.  Royston Cowper MD

## 2020-05-28 ENCOUNTER — Encounter (HOSPITAL_COMMUNITY): Payer: Self-pay | Admitting: Ophthalmology

## 2020-07-21 DIAGNOSIS — H348312 Tributary (branch) retinal vein occlusion, right eye, stable: Secondary | ICD-10-CM | POA: Diagnosis not present

## 2020-07-21 DIAGNOSIS — H53141 Visual discomfort, right eye: Secondary | ICD-10-CM | POA: Diagnosis not present

## 2020-07-21 DIAGNOSIS — H25812 Combined forms of age-related cataract, left eye: Secondary | ICD-10-CM | POA: Diagnosis not present

## 2020-07-21 DIAGNOSIS — H2521 Age-related cataract, morgagnian type, right eye: Secondary | ICD-10-CM | POA: Diagnosis not present

## 2020-07-23 DIAGNOSIS — H2513 Age-related nuclear cataract, bilateral: Secondary | ICD-10-CM | POA: Diagnosis not present

## 2020-07-23 DIAGNOSIS — H5203 Hypermetropia, bilateral: Secondary | ICD-10-CM | POA: Diagnosis not present

## 2020-08-05 DIAGNOSIS — H2511 Age-related nuclear cataract, right eye: Secondary | ICD-10-CM | POA: Diagnosis not present

## 2020-08-05 DIAGNOSIS — H25811 Combined forms of age-related cataract, right eye: Secondary | ICD-10-CM | POA: Diagnosis not present

## 2020-08-25 DIAGNOSIS — E785 Hyperlipidemia, unspecified: Secondary | ICD-10-CM | POA: Diagnosis not present

## 2020-09-02 DIAGNOSIS — E785 Hyperlipidemia, unspecified: Secondary | ICD-10-CM | POA: Diagnosis not present

## 2020-09-02 DIAGNOSIS — I10 Essential (primary) hypertension: Secondary | ICD-10-CM | POA: Diagnosis not present

## 2020-09-02 DIAGNOSIS — M810 Age-related osteoporosis without current pathological fracture: Secondary | ICD-10-CM | POA: Diagnosis not present

## 2020-09-02 DIAGNOSIS — K219 Gastro-esophageal reflux disease without esophagitis: Secondary | ICD-10-CM | POA: Diagnosis not present

## 2020-09-08 DIAGNOSIS — H35351 Cystoid macular degeneration, right eye: Secondary | ICD-10-CM | POA: Diagnosis not present

## 2020-10-26 DIAGNOSIS — H35351 Cystoid macular degeneration, right eye: Secondary | ICD-10-CM | POA: Diagnosis not present

## 2020-11-09 DIAGNOSIS — M1612 Unilateral primary osteoarthritis, left hip: Secondary | ICD-10-CM | POA: Diagnosis not present

## 2020-11-09 DIAGNOSIS — M7062 Trochanteric bursitis, left hip: Secondary | ICD-10-CM | POA: Diagnosis not present

## 2020-11-17 DIAGNOSIS — M25552 Pain in left hip: Secondary | ICD-10-CM | POA: Diagnosis not present

## 2020-11-22 DIAGNOSIS — M25552 Pain in left hip: Secondary | ICD-10-CM | POA: Diagnosis not present

## 2020-11-29 DIAGNOSIS — M25552 Pain in left hip: Secondary | ICD-10-CM | POA: Diagnosis not present

## 2020-11-29 DIAGNOSIS — R300 Dysuria: Secondary | ICD-10-CM | POA: Diagnosis not present

## 2020-11-30 DIAGNOSIS — N390 Urinary tract infection, site not specified: Secondary | ICD-10-CM | POA: Diagnosis not present

## 2020-12-15 ENCOUNTER — Telehealth: Payer: Self-pay

## 2020-12-15 NOTE — Telephone Encounter (Signed)
Called pt to r/s from 03/11/21 pal day, pt aware of new appt and req not to rec a calendar   Sophia Hernandez

## 2020-12-20 DIAGNOSIS — N390 Urinary tract infection, site not specified: Secondary | ICD-10-CM | POA: Diagnosis not present

## 2021-01-05 DIAGNOSIS — H26491 Other secondary cataract, right eye: Secondary | ICD-10-CM | POA: Diagnosis not present

## 2021-01-24 DIAGNOSIS — R1030 Lower abdominal pain, unspecified: Secondary | ICD-10-CM | POA: Diagnosis not present

## 2021-01-24 DIAGNOSIS — R82998 Other abnormal findings in urine: Secondary | ICD-10-CM | POA: Diagnosis not present

## 2021-02-03 IMAGING — MG DIGITAL SCREENING UNILAT LEFT W/ TOMO W/ CAD
6 series · 6 of 18 positions shown · non-contrast
Comparison: Previous exam(s).

CLINICAL DATA: Screening.

EXAM:
DIGITAL SCREENING UNILATERAL LEFT MAMMOGRAM WITH CAD AND TOMO

[L MLO synth-2D (1 of 2)]
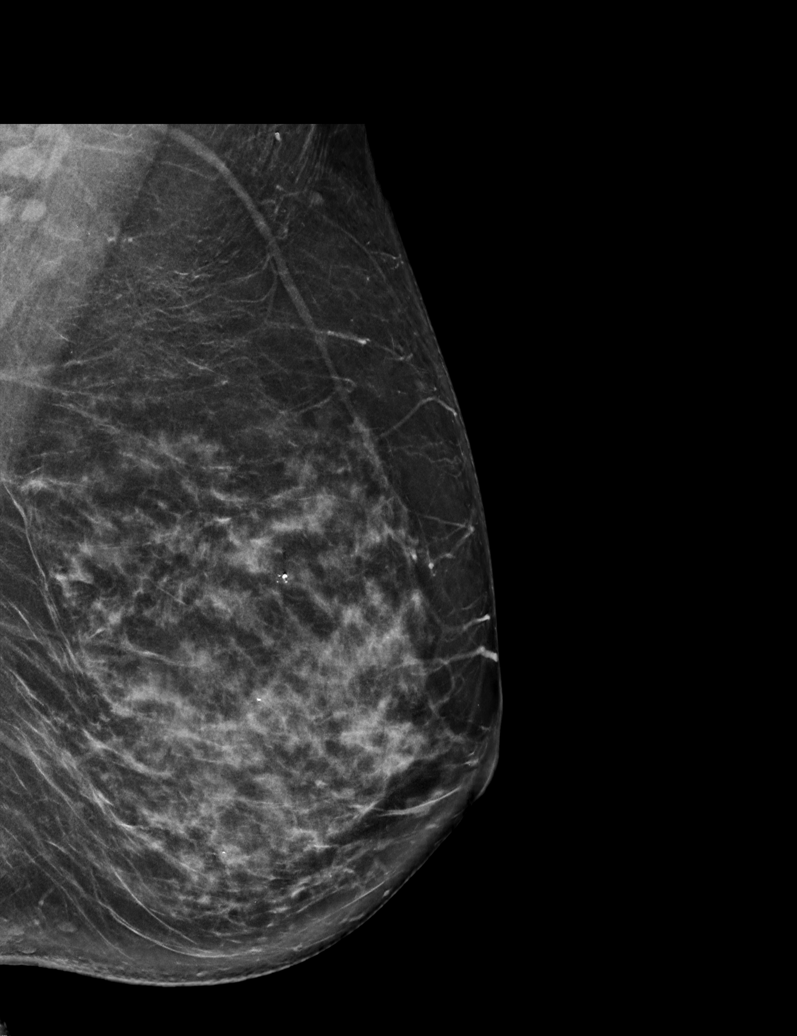

[L CC synth-2D]
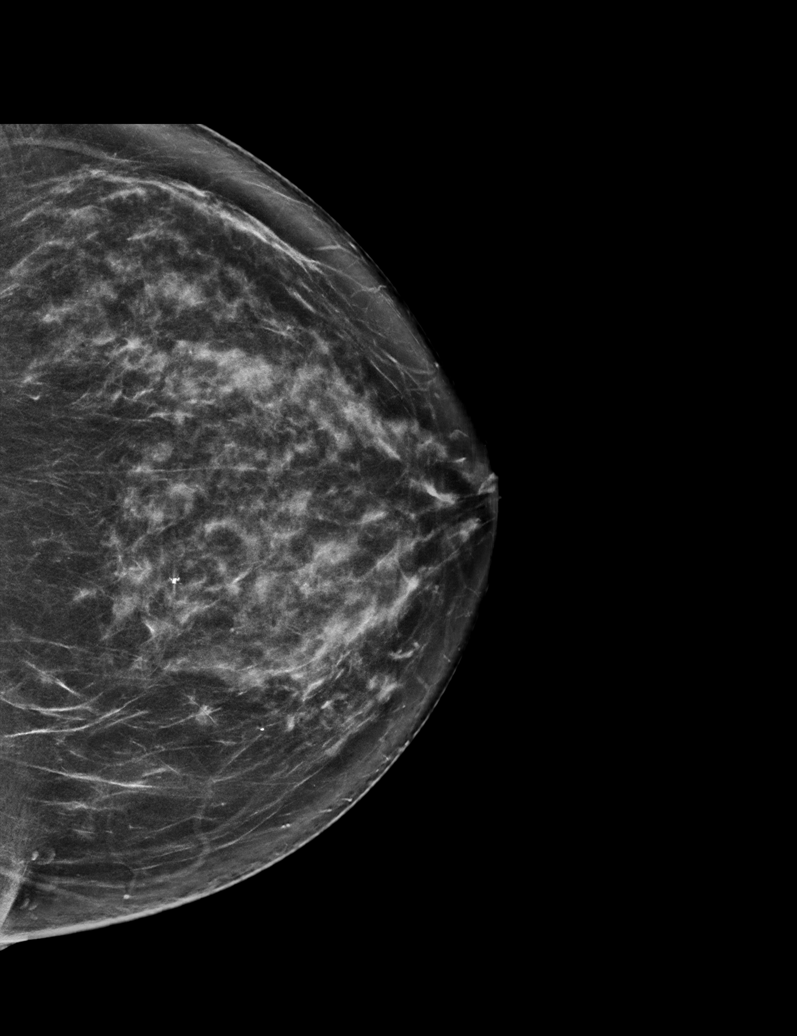

[L MLO synth-2D (2 of 2)]
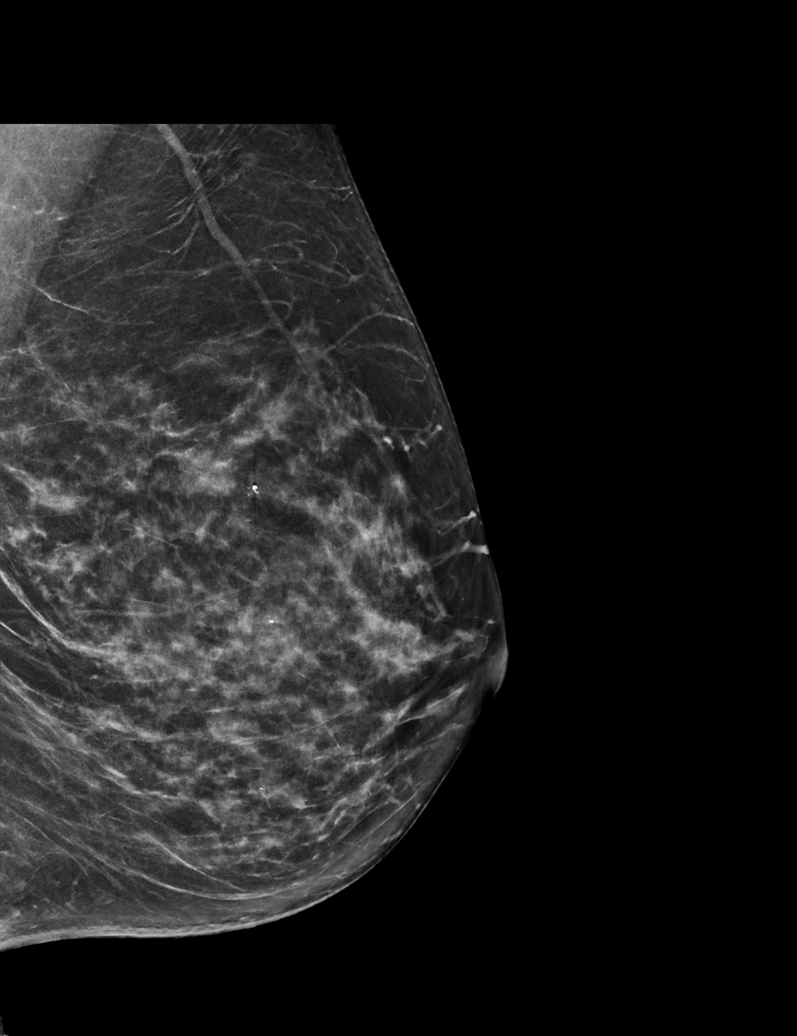

[L CC tomo · tomo slice 35/70.0]
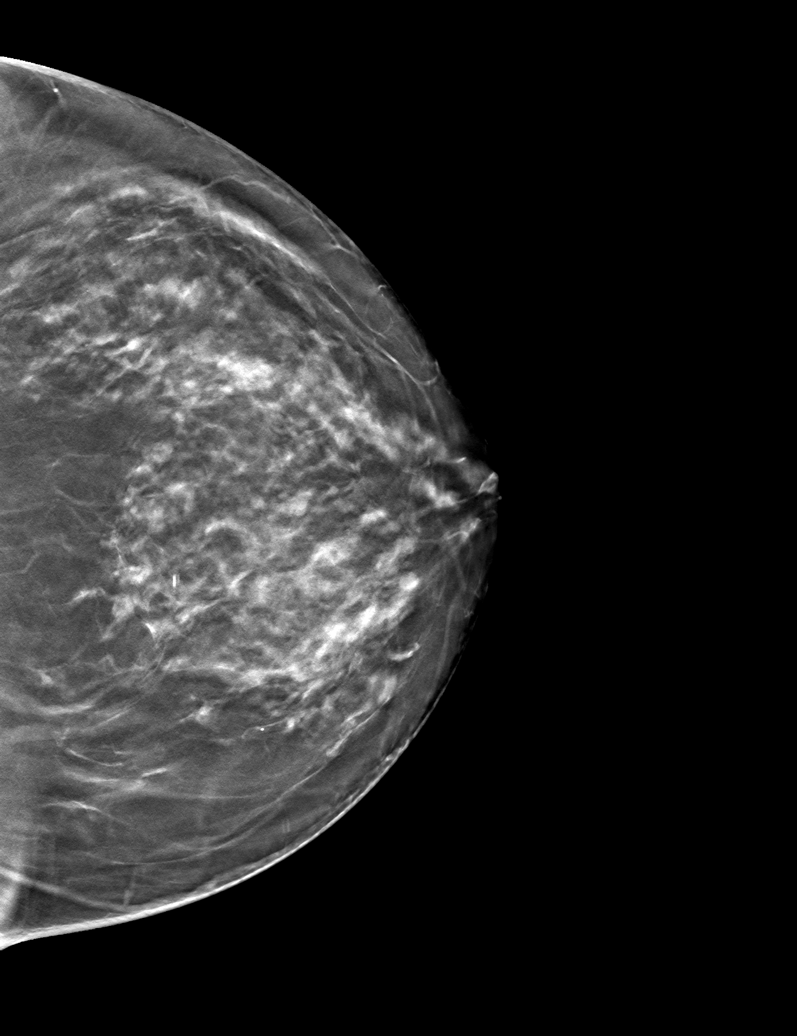

[L MLO tomo (1 of 2) · tomo slice 35/68.0]
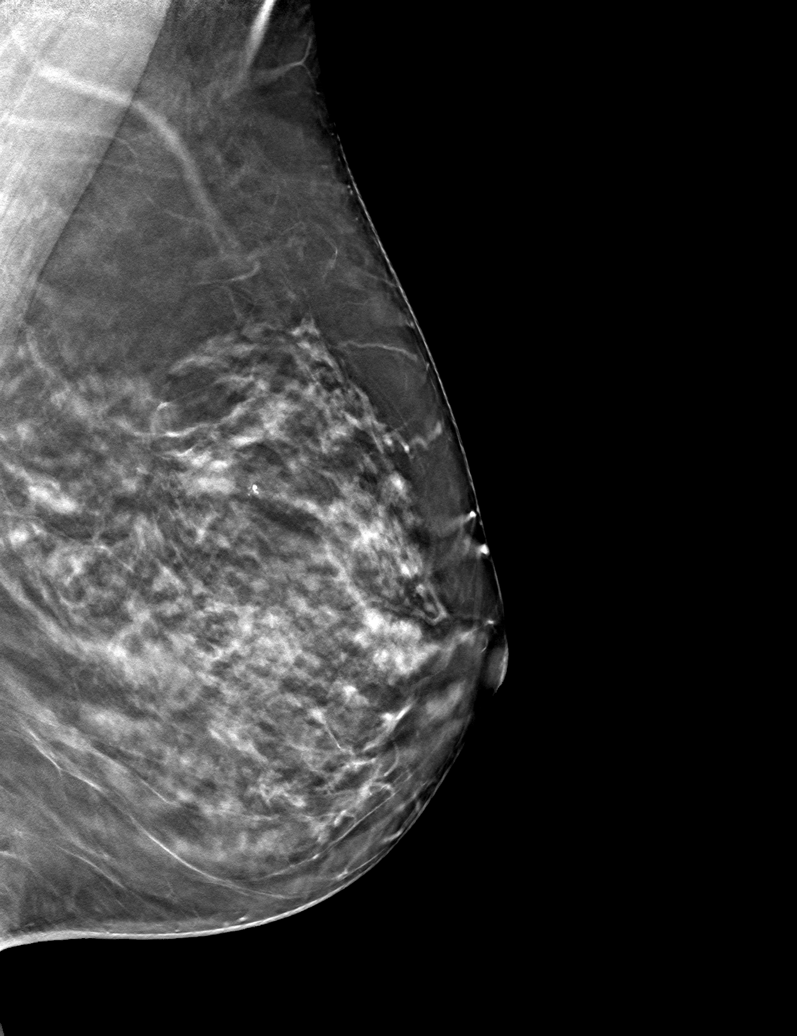

[L MLO tomo (2 of 2) · tomo slice 37/72.0]
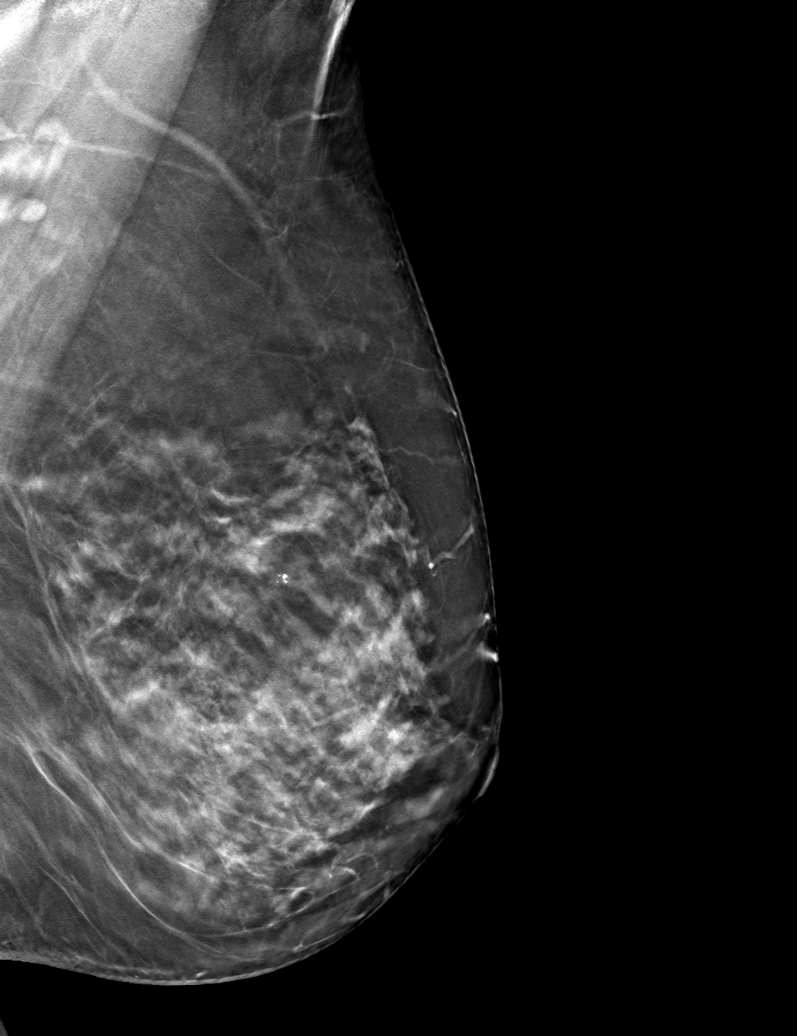

[6 of 18 positions shown; findings below may reference images not displayed]

ACR Breast Density Category c: The breast tissue is heterogeneously
dense, which may obscure small masses.
FINDINGS: The patient has had a right mastectomy. There are no findings
suspicious for malignancy.

Images were processed with CAD.
IMPRESSION: No mammographic evidence of malignancy. A result letter of this
screening mammogram will be mailed directly to the patient.

RECOMMENDATION:
Screening mammogram in one year.  (Code:SI-6-R6I)

BI-RADS CATEGORY  1: Negative.

## 2021-02-07 DIAGNOSIS — H35721 Serous detachment of retinal pigment epithelium, right eye: Secondary | ICD-10-CM | POA: Diagnosis not present

## 2021-02-07 DIAGNOSIS — H35363 Drusen (degenerative) of macula, bilateral: Secondary | ICD-10-CM | POA: Diagnosis not present

## 2021-02-07 DIAGNOSIS — H30021 Focal chorioretinal inflammation of posterior pole, right eye: Secondary | ICD-10-CM | POA: Diagnosis not present

## 2021-02-07 DIAGNOSIS — H338 Other retinal detachments: Secondary | ICD-10-CM | POA: Diagnosis not present

## 2021-02-07 DIAGNOSIS — H59811 Chorioretinal scars after surgery for detachment, right eye: Secondary | ICD-10-CM | POA: Diagnosis not present

## 2021-02-21 DIAGNOSIS — H532 Diplopia: Secondary | ICD-10-CM | POA: Diagnosis not present

## 2021-02-21 DIAGNOSIS — B0052 Herpesviral keratitis: Secondary | ICD-10-CM | POA: Diagnosis not present

## 2021-02-21 DIAGNOSIS — H35351 Cystoid macular degeneration, right eye: Secondary | ICD-10-CM | POA: Diagnosis not present

## 2021-03-03 DIAGNOSIS — F419 Anxiety disorder, unspecified: Secondary | ICD-10-CM | POA: Diagnosis not present

## 2021-03-03 DIAGNOSIS — E785 Hyperlipidemia, unspecified: Secondary | ICD-10-CM | POA: Diagnosis not present

## 2021-03-03 DIAGNOSIS — J449 Chronic obstructive pulmonary disease, unspecified: Secondary | ICD-10-CM | POA: Diagnosis not present

## 2021-03-03 DIAGNOSIS — I700 Atherosclerosis of aorta: Secondary | ICD-10-CM | POA: Diagnosis not present

## 2021-03-03 DIAGNOSIS — R7309 Other abnormal glucose: Secondary | ICD-10-CM | POA: Diagnosis not present

## 2021-03-03 DIAGNOSIS — F331 Major depressive disorder, recurrent, moderate: Secondary | ICD-10-CM | POA: Diagnosis not present

## 2021-03-03 DIAGNOSIS — E559 Vitamin D deficiency, unspecified: Secondary | ICD-10-CM | POA: Diagnosis not present

## 2021-03-08 DIAGNOSIS — H35351 Cystoid macular degeneration, right eye: Secondary | ICD-10-CM | POA: Diagnosis not present

## 2021-03-09 ENCOUNTER — Inpatient Hospital Stay: Payer: Medicare PPO | Attending: Family

## 2021-03-09 ENCOUNTER — Encounter: Payer: Self-pay | Admitting: Family

## 2021-03-09 ENCOUNTER — Other Ambulatory Visit: Payer: Self-pay

## 2021-03-09 ENCOUNTER — Inpatient Hospital Stay: Payer: Medicare PPO | Admitting: Family

## 2021-03-09 ENCOUNTER — Telehealth: Payer: Self-pay | Admitting: *Deleted

## 2021-03-09 VITALS — BP 128/71 | HR 76 | Temp 98.0°F | Resp 18 | Ht 60.0 in | Wt 130.4 lb

## 2021-03-09 DIAGNOSIS — Z853 Personal history of malignant neoplasm of breast: Secondary | ICD-10-CM | POA: Diagnosis not present

## 2021-03-09 DIAGNOSIS — R232 Flushing: Secondary | ICD-10-CM | POA: Diagnosis not present

## 2021-03-09 DIAGNOSIS — Z85118 Personal history of other malignant neoplasm of bronchus and lung: Secondary | ICD-10-CM | POA: Insufficient documentation

## 2021-03-09 DIAGNOSIS — Z9011 Acquired absence of right breast and nipple: Secondary | ICD-10-CM | POA: Insufficient documentation

## 2021-03-09 LAB — CBC WITH DIFFERENTIAL (CANCER CENTER ONLY)
Abs Immature Granulocytes: 0.01 10*3/uL (ref 0.00–0.07)
Basophils Absolute: 0 10*3/uL (ref 0.0–0.1)
Basophils Relative: 0 %
Eosinophils Absolute: 0 10*3/uL (ref 0.0–0.5)
Eosinophils Relative: 0 %
HCT: 39.2 % (ref 36.0–46.0)
Hemoglobin: 13.1 g/dL (ref 12.0–15.0)
Immature Granulocytes: 0 %
Lymphocytes Relative: 40 %
Lymphs Abs: 1.9 10*3/uL (ref 0.7–4.0)
MCH: 32.8 pg (ref 26.0–34.0)
MCHC: 33.4 g/dL (ref 30.0–36.0)
MCV: 98.2 fL (ref 80.0–100.0)
Monocytes Absolute: 0.3 10*3/uL (ref 0.1–1.0)
Monocytes Relative: 6 %
Neutro Abs: 2.6 10*3/uL (ref 1.7–7.7)
Neutrophils Relative %: 54 %
Platelet Count: 196 10*3/uL (ref 150–400)
RBC: 3.99 MIL/uL (ref 3.87–5.11)
RDW: 12 % (ref 11.5–15.5)
WBC Count: 4.8 10*3/uL (ref 4.0–10.5)
nRBC: 0 % (ref 0.0–0.2)

## 2021-03-09 LAB — CMP (CANCER CENTER ONLY)
ALT: 15 U/L (ref 0–44)
AST: 17 U/L (ref 15–41)
Albumin: 4.7 g/dL (ref 3.5–5.0)
Alkaline Phosphatase: 54 U/L (ref 38–126)
Anion gap: 8 (ref 5–15)
BUN: 26 mg/dL — ABNORMAL HIGH (ref 8–23)
CO2: 29 mmol/L (ref 22–32)
Calcium: 10.1 mg/dL (ref 8.9–10.3)
Chloride: 100 mmol/L (ref 98–111)
Creatinine: 0.78 mg/dL (ref 0.44–1.00)
GFR, Estimated: >60 mL/min (ref >60)
Glucose, Bld: 97 mg/dL (ref 70–99)
Potassium: 3.7 mmol/L (ref 3.5–5.1)
Sodium: 137 mmol/L (ref 135–145)
Total Bilirubin: 0.6 mg/dL (ref 0.3–1.2)
Total Protein: 6.9 g/dL (ref 6.5–8.1)

## 2021-03-09 NOTE — Telephone Encounter (Signed)
Per 03/09/21 los - Follow-up PRN

## 2021-03-09 NOTE — Progress Notes (Signed)
Hematology and Oncology Follow Up Visit  Sophia Hernandez [id number] Feb 17, 1946 75 y.o. 03/09/2021   Principle Diagnosis:  Stage III (T3 N2 M0) ductal carcinoma of the right breast --     remission x 16 years. History of stage I (T1 N0 M0) non-small cell lung cancer of the     right lung -- remission x 23 years.   Current Therapy:        Observation   Interim History:  Sophia Hernandez is here today for follow-up. She is doing quite well and states that she had surgery last fall for a detached retina and is still recuperating.  No adenopathy or lymphedema noted. Left breast exam was negative, right mastectomy intact. No mass, lesion or rash noted.  She states that she will schedule her mammogram again when due in August.  She has bursitis in her right shoulder and left hip. She describes this as tolerable.  She has a hot flash every 4 months or so. She is looking for a new Primary care and I directed her to [facility]  primary care her on the second floor. She plans to stop and schedule on her way out.  No fever, chills, n/v, cough, rash, dizziness, SOB, chest pain, palpitations, abdominal pain or changes in bowel or bladder habits.  She has not noted any blood loss. No bruising or petechiae.  No swelling, tenderness, numbness or tingling in her extremities.  No falls or syncope to report.  She has maintained a good appetite and is staying well hydrated. Her weight 130 lbs.   ECOG Performance Status: 0 - Asymptomatic  Medications:  Allergies as of 03/09/2021       Reactions   Sulfa Antibiotics Rash   Penicillins Rash        Medication List        Accurate as of March 09, 2021 11:25 AM. If you have any questions, ask your nurse or doctor.          STOP taking these medications    Omega-3 1000 MG Caps Stopped by: [name], NP       TAKE these medications    aspirin 81 MG tablet Take 81 mg by mouth daily.   Klor-Con M20 20 MEQ tablet Generic drug:  potassium chloride SA Take by mouth daily.   losartan-hydrochlorothiazide 100-12.5 MG tablet Commonly known as: HYZAAR Take 1 tablet by mouth daily.   MULTIVITAMIN PO Take by mouth every morning.   Omeprazole 20 MG Tbec Take 1 tablet by mouth daily as needed (For acid reflux).   prednisoLONE acetate 1 % ophthalmic suspension Commonly known as: PRED FORTE prednisolone acetate 1 % eye drops,suspension  INSTILL 1 DROP IN INTO RIGHT EYE TWICE A DAY   simvastatin 20 MG tablet Commonly known as: ZOCOR Take 20 mg by mouth at bedtime.   vitamin B-12 100 MCG tablet Commonly known as: CYANOCOBALAMIN Take 100 mcg by mouth See admin instructions. Take one tablet by mouth on Monday Wednesday and Fridays   Vitamin D 125 MCG (5000 UT) Caps Take 5,000 Int'l Units by mouth daily.        Allergies:  Allergies  Allergen Reactions   Sulfa Antibiotics Rash   Penicillins Rash    Past Medical History, Surgical history, Social history, and Family History were reviewed and updated.  Review of Systems: All other 10 point review of systems is negative.   Physical Exam:  height is 5' (1.524 m) and weight is 130 lb 6.4 oz (  59.1 kg). Her oral temperature is 98 F (36.7 C). Her blood pressure is 128/71 and her pulse is 76. Her respiration is 18 and oxygen saturation is 100%.   Wt Readings from Last 3 Encounters:  03/09/21 130 lb 6.4 oz (59.1 kg)  05/27/20 128 lb (58.1 kg)  03/11/20 133 lb (60.3 kg)    Ocular: Sclerae unicteric, pupils equal, round and reactive to light Ear-nose-throat: Oropharynx clear, dentition fair Lymphatic: No cervical, supraclavicular or axillary adenopathy Lungs no rales or rhonchi, good excursion bilaterally Heart regular rate and rhythm, no murmur appreciated Abd soft, nontender, positive bowel sounds, no liver or spleen tip palpated on exam, no fluid wave  MSK no focal spinal tenderness, no joint edema Neuro: non-focal, well-oriented, appropriate  affect Breasts: Left breast exam was negative, right mastectomy intact. No mass, lesion or rash noted.   Lab Results  Component Value Date   WBC 4.8 03/09/2021   HGB 13.1 03/09/2021   HCT 39.2 03/09/2021   MCV 98.2 03/09/2021   PLT 196 03/09/2021   No results found for: FERRITIN, IRON, TIBC, UIBC, IRONPCTSAT Lab Results  Component Value Date   RBC 3.99 03/09/2021   No results found for: KPAFRELGTCHN, LAMBDASER, KAPLAMBRATIO No results found for: Kandis Cocking, IGMSERUM No results found for: Odetta Pink, SPEI   Chemistry      Component Value Date/Time   NA 137 03/09/2021 1008   NA 138 10/26/2016 1032   K 3.7 03/09/2021 1008   K 4.2 10/26/2016 1032   CL 100 03/09/2021 1008   CL 99 10/26/2014 1042   CO2 29 03/09/2021 1008   CO2 28 10/26/2016 1032   BUN 26 (H) 03/09/2021 1008   BUN 20.1 10/26/2016 1032   CREATININE 0.78 03/09/2021 1008   CREATININE 0.8 10/26/2016 1032      Component Value Date/Time   CALCIUM 10.1 03/09/2021 1008   CALCIUM 10.2 10/26/2016 1032   ALKPHOS 54 03/09/2021 1008   ALKPHOS 71 10/26/2016 1032   AST 17 03/09/2021 1008   AST 20 10/26/2016 1032   ALT 15 03/09/2021 1008   ALT 19 10/26/2016 1032   BILITOT 0.6 03/09/2021 1008   BILITOT 0.63 10/26/2016 1032       Impression and Plan: Sophia Hernandez is a very pleasant 75 yo caucasian female with history of both breast and lung cancer. She is greater that 20 years out from diagnosis and treatment of both of these.  She continues to do well and so far there has been no evidence of recurrence.  At this time we can let her go from our clinic to follow-up with her PCP.  She plans to go downstairs after today's visit and schedule a new patient appointment with [facility].  We would be happy to see her again if needed for any future heme/onc issues that may occur.  She can contact our office with any questions or concerns.   [name],  NP 6/22/202211:25 AM

## 2021-03-11 ENCOUNTER — Other Ambulatory Visit: Payer: Medicare PPO

## 2021-03-11 ENCOUNTER — Ambulatory Visit: Payer: Medicare PPO | Admitting: Family

## 2021-03-23 DIAGNOSIS — H2513 Age-related nuclear cataract, bilateral: Secondary | ICD-10-CM | POA: Diagnosis not present

## 2021-03-23 DIAGNOSIS — H35351 Cystoid macular degeneration, right eye: Secondary | ICD-10-CM | POA: Diagnosis not present

## 2021-04-04 ENCOUNTER — Other Ambulatory Visit: Payer: Self-pay | Admitting: Hematology & Oncology

## 2021-04-04 DIAGNOSIS — Z1231 Encounter for screening mammogram for malignant neoplasm of breast: Secondary | ICD-10-CM

## 2021-04-21 DIAGNOSIS — H25811 Combined forms of age-related cataract, right eye: Secondary | ICD-10-CM | POA: Diagnosis not present

## 2021-04-21 DIAGNOSIS — H2512 Age-related nuclear cataract, left eye: Secondary | ICD-10-CM | POA: Diagnosis not present

## 2021-05-03 ENCOUNTER — Encounter: Payer: Self-pay | Admitting: Gastroenterology

## 2021-05-10 ENCOUNTER — Other Ambulatory Visit: Payer: Self-pay | Admitting: Orthopedic Surgery

## 2021-05-10 DIAGNOSIS — M1612 Unilateral primary osteoarthritis, left hip: Secondary | ICD-10-CM

## 2021-05-11 ENCOUNTER — Inpatient Hospital Stay: Admission: RE | Admit: 2021-05-11 | Payer: Medicare PPO | Source: Ambulatory Visit

## 2021-05-18 DIAGNOSIS — I10 Essential (primary) hypertension: Secondary | ICD-10-CM | POA: Diagnosis not present

## 2021-05-18 DIAGNOSIS — N390 Urinary tract infection, site not specified: Secondary | ICD-10-CM | POA: Diagnosis not present

## 2021-05-18 DIAGNOSIS — M25552 Pain in left hip: Secondary | ICD-10-CM | POA: Diagnosis not present

## 2021-05-18 DIAGNOSIS — R5383 Other fatigue: Secondary | ICD-10-CM | POA: Diagnosis not present

## 2021-05-18 DIAGNOSIS — E785 Hyperlipidemia, unspecified: Secondary | ICD-10-CM | POA: Diagnosis not present

## 2021-05-18 DIAGNOSIS — K219 Gastro-esophageal reflux disease without esophagitis: Secondary | ICD-10-CM | POA: Diagnosis not present

## 2021-05-18 DIAGNOSIS — Z6825 Body mass index (BMI) 25.0-25.9, adult: Secondary | ICD-10-CM | POA: Diagnosis not present

## 2021-05-18 DIAGNOSIS — M1990 Unspecified osteoarthritis, unspecified site: Secondary | ICD-10-CM | POA: Diagnosis not present

## 2021-05-26 ENCOUNTER — Ambulatory Visit: Payer: Medicare PPO

## 2021-05-30 ENCOUNTER — Other Ambulatory Visit: Payer: Self-pay

## 2021-05-30 ENCOUNTER — Other Ambulatory Visit: Payer: Self-pay | Admitting: Hematology & Oncology

## 2021-05-30 ENCOUNTER — Ambulatory Visit
Admission: RE | Admit: 2021-05-30 | Discharge: 2021-05-30 | Disposition: A | Discharge status: Home / Self Care | Payer: Medicare PPO | Source: Ambulatory Visit | Attending: Hematology & Oncology | Admitting: Hematology & Oncology

## 2021-05-30 DIAGNOSIS — Z1231 Encounter for screening mammogram for malignant neoplasm of breast: Secondary | ICD-10-CM

## 2021-06-01 DIAGNOSIS — M25552 Pain in left hip: Secondary | ICD-10-CM | POA: Diagnosis not present

## 2021-06-14 DIAGNOSIS — Z8041 Family history of malignant neoplasm of ovary: Secondary | ICD-10-CM | POA: Diagnosis not present

## 2021-06-14 DIAGNOSIS — I10 Essential (primary) hypertension: Secondary | ICD-10-CM | POA: Diagnosis not present

## 2021-06-14 DIAGNOSIS — M1990 Unspecified osteoarthritis, unspecified site: Secondary | ICD-10-CM | POA: Diagnosis not present

## 2021-06-14 DIAGNOSIS — E785 Hyperlipidemia, unspecified: Secondary | ICD-10-CM | POA: Diagnosis not present

## 2021-06-14 DIAGNOSIS — Z7982 Long term (current) use of aspirin: Secondary | ICD-10-CM | POA: Diagnosis not present

## 2021-06-14 DIAGNOSIS — M810 Age-related osteoporosis without current pathological fracture: Secondary | ICD-10-CM | POA: Diagnosis not present

## 2021-06-14 DIAGNOSIS — K219 Gastro-esophageal reflux disease without esophagitis: Secondary | ICD-10-CM | POA: Diagnosis not present

## 2021-06-14 DIAGNOSIS — I700 Atherosclerosis of aorta: Secondary | ICD-10-CM | POA: Diagnosis not present

## 2021-06-14 DIAGNOSIS — G8929 Other chronic pain: Secondary | ICD-10-CM | POA: Diagnosis not present

## 2021-09-16 DIAGNOSIS — N390 Urinary tract infection, site not specified: Secondary | ICD-10-CM | POA: Diagnosis not present

## 2021-10-13 DIAGNOSIS — N390 Urinary tract infection, site not specified: Secondary | ICD-10-CM | POA: Diagnosis not present

## 2021-11-30 DIAGNOSIS — Z0000 Encounter for general adult medical examination without abnormal findings: Secondary | ICD-10-CM | POA: Diagnosis not present

## 2021-11-30 DIAGNOSIS — D519 Vitamin B12 deficiency anemia, unspecified: Secondary | ICD-10-CM | POA: Diagnosis not present

## 2021-11-30 DIAGNOSIS — R799 Abnormal finding of blood chemistry, unspecified: Secondary | ICD-10-CM | POA: Diagnosis not present

## 2021-11-30 DIAGNOSIS — E785 Hyperlipidemia, unspecified: Secondary | ICD-10-CM | POA: Diagnosis not present

## 2021-11-30 DIAGNOSIS — Z0100 Encounter for examination of eyes and vision without abnormal findings: Secondary | ICD-10-CM | POA: Diagnosis not present

## 2021-11-30 DIAGNOSIS — I10 Essential (primary) hypertension: Secondary | ICD-10-CM | POA: Diagnosis not present

## 2021-11-30 DIAGNOSIS — E559 Vitamin D deficiency, unspecified: Secondary | ICD-10-CM | POA: Diagnosis not present

## 2022-04-06 ENCOUNTER — Ambulatory Visit: Payer: Medicare PPO | Admitting: Family

## 2022-04-06 ENCOUNTER — Other Ambulatory Visit: Payer: Medicare PPO

## 2022-04-14 ENCOUNTER — Other Ambulatory Visit: Payer: Self-pay | Admitting: *Deleted

## 2022-04-14 DIAGNOSIS — Z853 Personal history of malignant neoplasm of breast: Secondary | ICD-10-CM

## 2022-04-17 ENCOUNTER — Inpatient Hospital Stay: Payer: Medicare PPO | Attending: Hematology & Oncology

## 2022-04-17 ENCOUNTER — Inpatient Hospital Stay: Payer: Medicare PPO | Admitting: Family

## 2022-04-17 ENCOUNTER — Encounter: Payer: Self-pay | Admitting: Family

## 2022-04-17 VITALS — BP 148/75 | HR 89 | Temp 98.0°F | Resp 17 | Ht 60.0 in | Wt 128.0 lb

## 2022-04-17 DIAGNOSIS — Z08 Encounter for follow-up examination after completed treatment for malignant neoplasm: Secondary | ICD-10-CM | POA: Insufficient documentation

## 2022-04-17 DIAGNOSIS — Z853 Personal history of malignant neoplasm of breast: Secondary | ICD-10-CM | POA: Diagnosis not present

## 2022-04-17 DIAGNOSIS — K219 Gastro-esophageal reflux disease without esophagitis: Secondary | ICD-10-CM | POA: Insufficient documentation

## 2022-04-17 LAB — CMP (CANCER CENTER ONLY)
ALT: 20 U/L (ref 0–44)
AST: 25 U/L (ref 15–41)
Albumin: 4.2 g/dL (ref 3.5–5.0)
Alkaline Phosphatase: 60 U/L (ref 38–126)
Anion gap: 7 (ref 5–15)
BUN: 23 mg/dL (ref 8–23)
CO2: 27 mmol/L (ref 22–32)
Calcium: 9.4 mg/dL (ref 8.9–10.3)
Chloride: 104 mmol/L (ref 98–111)
Creatinine: 0.72 mg/dL (ref 0.44–1.00)
GFR, Estimated: >60 mL/min (ref >60)
Glucose, Bld: 166 mg/dL — ABNORMAL HIGH (ref 70–99)
Potassium: 3.4 mmol/L — ABNORMAL LOW (ref 3.5–5.1)
Sodium: 138 mmol/L (ref 135–145)
Total Bilirubin: 0.5 mg/dL (ref 0.3–1.2)
Total Protein: 7 g/dL (ref 6.5–8.1)

## 2022-04-17 LAB — CBC WITH DIFFERENTIAL (CANCER CENTER ONLY)
Abs Immature Granulocytes: 0.03 10*3/uL (ref 0.00–0.07)
Basophils Absolute: 0 10*3/uL (ref 0.0–0.1)
Basophils Relative: 0 %
Eosinophils Absolute: 0 10*3/uL (ref 0.0–0.5)
Eosinophils Relative: 1 %
HCT: 38.1 % (ref 36.0–46.0)
Hemoglobin: 12.9 g/dL (ref 12.0–15.0)
Immature Granulocytes: 1 %
Lymphocytes Relative: 35 %
Lymphs Abs: 2.3 10*3/uL (ref 0.7–4.0)
MCH: 33.1 pg (ref 26.0–34.0)
MCHC: 33.9 g/dL (ref 30.0–36.0)
MCV: 97.7 fL (ref 80.0–100.0)
Monocytes Absolute: 0.3 10*3/uL (ref 0.1–1.0)
Monocytes Relative: 4 %
Neutro Abs: 3.9 10*3/uL (ref 1.7–7.7)
Neutrophils Relative %: 59 %
Platelet Count: 239 10*3/uL (ref 150–400)
RBC: 3.9 MIL/uL (ref 3.87–5.11)
RDW: 12.7 % (ref 11.5–15.5)
WBC Count: 6.5 10*3/uL (ref 4.0–10.5)
nRBC: 0 % (ref 0.0–0.2)

## 2022-04-17 NOTE — Progress Notes (Incomplete)
Hematology and Oncology Follow Up Visit  Ebunoluwa Gernert [id number] 1946/03/04 76 y.o. 04/17/2022   Principle Diagnosis:  Stage III (T3 N2 M0) ductal carcinoma of the right breast --     remission x 16 years. History of stage I (T1 N0 M0) non-small cell lung cancer of the     right lung -- remission x 23 years.   Current Therapy:        Observation   Interim History:  Ms. Sophia Hernandez is here today for follow-up. She is doing fairly well but has been having some fatigue, epigastric discomfort as well as abdominal bloating and gas. She notes GERD at night. She has not been taking her omeprazole.  She states that she has been under a lot of stress with her husbands diagnosis of lung cancer earlier this year and treatment.  Left mastectomy and right breast exam negative. No mass, lesion or rash noted. No adenopathy or lymphedema on exam.  She did start estradiol cream for frequent UTI and notes that she no longer has hot flashes. Ok per Dr. Marin Olp.  No fever, chills, n/v, cough, rash, dizziness, SOB, chest pain, palpitations or changes in bowel or bladder habits.  No blood loss noted. No bruising or petechiae.  No swelling, tenderness, numbness or tingling in her extremities.  No falls or syncope.  Appetite and hydration are good. Her weight is stable at 128 lbs.   ECOG Performance Status: 1 - Symptomatic but completely ambulatory  Medications:  Allergies as of 04/17/2022       Reactions   Sulfa Antibiotics Rash   Penicillins Rash        Medication List        Accurate as of April 17, 2022  2:47 PM. If you have any questions, ask your nurse or doctor.          aspirin 81 MG tablet Take 81 mg by mouth daily.   Klor-Con M20 20 MEQ tablet Generic drug: potassium chloride SA Take by mouth daily.   losartan-hydrochlorothiazide 100-12.5 MG tablet Commonly known as: HYZAAR Take 1 tablet by mouth daily.   MULTIVITAMIN PO Take by mouth every morning.   Omeprazole 20  MG Tbec Take 1 tablet by mouth daily as needed (For acid reflux).   prednisoLONE acetate 1 % ophthalmic suspension Commonly known as: PRED FORTE prednisolone acetate 1 % eye drops,suspension  INSTILL 1 DROP IN INTO RIGHT EYE TWICE A DAY   simvastatin 20 MG tablet Commonly known as: ZOCOR Take 20 mg by mouth at bedtime.   vitamin B-12 100 MCG tablet Commonly known as: CYANOCOBALAMIN Take 100 mcg by mouth See admin instructions. Take one tablet by mouth on Monday Wednesday and Fridays   Vitamin D 125 MCG (5000 UT) Caps Take 5,000 Int'l Units by mouth daily.        Allergies:  Allergies  Allergen Reactions   Sulfa Antibiotics Rash   Penicillins Rash    Past Medical History, Surgical history, Social history, and Family History were reviewed and updated.  Review of Systems: All other 10 point review of systems is negative.   Physical Exam:  height is 5' (1.524 m) and weight is 128 lb (58.1 kg). Her oral temperature is 98 F (36.7 C). Her blood pressure is 148/75 (abnormal) and her pulse is 89. Her respiration is 17 and oxygen saturation is 99%.   Wt Readings from Last 3 Encounters:  04/17/22 128 lb (58.1 kg)  03/09/21 130 lb 6.4 oz (59.1 kg)  05/27/20 128 lb (58.1 kg)    Ocular: Sclerae unicteric, pupils equal, round and reactive to light Ear-nose-throat: Oropharynx clear, dentition fair Lymphatic: No cervical, supraclavicular or axillary adenopathy Lungs no rales or rhonchi, good excursion bilaterally Heart regular rate and rhythm, no murmur appreciated Abd soft, nontender, positive bowel sounds MSK no focal spinal tenderness, no joint edema Neuro: non-focal, well-oriented, appropriate affect Breasts: Right mastectomy intact, left breast exam negative. No mass, lesion or rash noted.   Lab Results  Component Value Date   WBC 6.5 04/17/2022   HGB 12.9 04/17/2022   HCT 38.1 04/17/2022   MCV 97.7 04/17/2022   PLT 239 04/17/2022   No results found for:  "FERRITIN", "IRON", "TIBC", "UIBC", "IRONPCTSAT" Lab Results  Component Value Date   RBC 3.90 04/17/2022   No results found for: "KPAFRELGTCHN", "LAMBDASER", "KAPLAMBRATIO" No results found for: "IGGSERUM", "IGA", "IGMSERUM" No results found for: "TOTALPROTELP", "ALBUMINELP", "A1GS", "A2GS", "BETS", "BETA2SER", "GAMS", "MSPIKE", "SPEI"   Chemistry      Component Value Date/Time   NA 137 03/09/2021 1008   NA 138 10/26/2016 1032   K 3.7 03/09/2021 1008   K 4.2 10/26/2016 1032   CL 100 03/09/2021 1008   CL 99 10/26/2014 1042   CO2 29 03/09/2021 1008   CO2 28 10/26/2016 1032   BUN 26 (H) 03/09/2021 1008   BUN 20.1 10/26/2016 1032   CREATININE 0.78 03/09/2021 1008   CREATININE 0.8 10/26/2016 1032      Component Value Date/Time   CALCIUM 10.1 03/09/2021 1008   CALCIUM 10.2 10/26/2016 1032   ALKPHOS 54 03/09/2021 1008   ALKPHOS 71 10/26/2016 1032   AST 17 03/09/2021 1008   AST 20 10/26/2016 1032   ALT 15 03/09/2021 1008   ALT 19 10/26/2016 1032   BILITOT 0.6 03/09/2021 1008   BILITOT 0.63 10/26/2016 1032       Impression and Plan: Ms. Skarzynski is a very pleasant 76 yo caucasian female with history of both breast and lung cancer. She is greater that 20 years out from diagnosis and treatment of both of these.  Exam today's was negative.  Mammogram due again in September.  She will try taking her omeprazole at bedtime daily and see if this helps with her symptoms of GERD and bloating.  She will continue to follow-up as needed.   Lottie Dawson, NP 7/31/20232:47 PM

## 2022-04-18 ENCOUNTER — Telehealth: Payer: Self-pay | Admitting: *Deleted

## 2022-04-18 NOTE — Telephone Encounter (Signed)
Per 04/17/22 los - Follow-up as needed.

## 2022-05-05 ENCOUNTER — Other Ambulatory Visit: Payer: Self-pay | Admitting: Orthopedic Surgery

## 2022-05-05 DIAGNOSIS — M1612 Unilateral primary osteoarthritis, left hip: Secondary | ICD-10-CM

## 2022-05-23 ENCOUNTER — Other Ambulatory Visit: Payer: Self-pay | Admitting: Hematology & Oncology

## 2022-05-23 DIAGNOSIS — Z1231 Encounter for screening mammogram for malignant neoplasm of breast: Secondary | ICD-10-CM

## 2022-05-31 ENCOUNTER — Ambulatory Visit
Admission: RE | Admit: 2022-05-31 | Discharge: 2022-05-31 | Disposition: A | Discharge status: Home / Self Care | Payer: Medicare PPO | Source: Ambulatory Visit | Attending: Hematology & Oncology | Admitting: Hematology & Oncology

## 2022-05-31 DIAGNOSIS — Z1231 Encounter for screening mammogram for malignant neoplasm of breast: Secondary | ICD-10-CM

## 2022-08-03 LAB — COLOGUARD: COLOGUARD: NEGATIVE

## 2022-09-01 ENCOUNTER — Other Ambulatory Visit: Payer: Self-pay | Admitting: Urology

## 2022-09-04 ENCOUNTER — Other Ambulatory Visit: Payer: Self-pay | Admitting: Urology

## 2022-10-30 NOTE — Patient Instructions (Signed)
DUE TO COVID-19 ONLY TWO VISITORS  (aged 79 and older)  ARE ALLOWED TO COME WITH YOU AND STAY IN THE WAITING ROOM ONLY DURING PRE OP AND PROCEDURE.   **NO VISITORS ARE ALLOWED IN THE SHORT STAY AREA OR RECOVERY ROOM!!**  IF YOU WILL BE ADMITTED INTO THE HOSPITAL YOU ARE ALLOWED ONLY FOUR SUPPORT PEOPLE DURING VISITATION HOURS ONLY (7 AM -8PM)   The support person(s) must pass our screening, gel in and out, and wear a mask at all times, including in the patient's room. Patients must also wear a mask when staff or their support person are in the room. Visitors GUEST BADGE MUST BE WORN VISIBLY  One adult visitor may remain with you overnight and MUST be in the room by 8 P.M.     Your procedure is scheduled on: 11/16/22   Report to [facility] Main Entrance    Report to admitting at  5:15 AM   Call this number if you have problems the morning of surgery [phone]   Do not eat food or drink:After Midnight.           If you have questions, please contact your surgeon's office.   FOLLOW BOWEL PREP AND ANY ADDITIONAL PRE OP INSTRUCTIONS YOU RECEIVED FROM YOUR SURGEON'S OFFICE!!!              Magnesium citrate and fleets enema   Oral Hygiene is also important to reduce your risk of infection.                                    Remember - BRUSH YOUR TEETH THE MORNING OF SURGERY WITH YOUR REGULAR TOOTHPASTE  DENTURES WILL BE REMOVED PRIOR TO SURGERY PLEASE DO NOT APPLY "Poly grip" OR ADHESIVES!!!   Do NOT smoke after Midnight     Take these medicines the morning of surgery with A SIP OF WATER: none   Before surgery.Stop taking _ASA 81 mg_______last dose on __2/22/24________as instructed by _____________.    Bring CPAP mask and tubing day of surgery.                              You may not have any metal on your body including hair pins, jewelry, and body piercing             Do not wear make-up, lotions, powders, perfumes/cologne, or deodorant  Do not wear nail polish  including gel and S&S, artificial/acrylic nails, or any other type of covering on natural nails including finger and toenails. If you have artificial nails, gel coating, etc. that needs to be removed by a nail salon please have this removed prior to surgery or surgery may need to be canceled/ delayed if the surgeon/ anesthesia feels like they are unable to be safely monitored.   Do not shave  48 hours prior to surgery.     Do not bring valuables to the hospital. [place].   Contacts, glasses, or bridgework may not be worn into surgery.   Bring small overnight bag day of surgery.   DO NOT [place]. PHARMACY WILL DISPENSE MEDICATIONS LISTED ON YOUR MEDICATION LIST TO YOU DURING YOUR ADMISSION [facility]!  Special Instructions: Bring a copy of your healthcare power of attorney and living will documents the day of surgery if you haven't scanned them before.              Please read over the following fact sheets you were given: IF YOU HAVE QUESTIONS ABOUT YOUR PRE-OP INSTRUCTIONS PLEASE CALL [phone]    [facility] - Preparing for Surgery Before surgery, you can play an important role.  Because skin is not sterile, your skin needs to be as free of germs as possible.  You can reduce the number of germs on your skin by washing with CHG (chlorahexidine gluconate) soap before surgery.  CHG is an antiseptic cleaner which kills germs and bonds with the skin to continue killing germs even after washing. Please DO NOT use if you have an allergy to CHG or antibacterial soaps.  If your skin becomes reddened/irritated stop using the CHG and inform your nurse when you arrive at Short Stay. Do not shave (including legs and underarms) for at least 48 hours prior to the first CHG shower.  Please follow these instructions carefully:  1.  Shower with CHG Soap the night before surgery and the  morning of  Surgery.  2.  If you choose to wash your hair, wash your hair first as usual with your  normal  shampoo.  3.  After you shampoo, rinse your hair and body thoroughly to remove the  shampoo.                            4.  Use CHG as you would any other liquid soap.  You can apply chg directly  to the skin and wash                       Gently with a scrungie or clean washcloth.  5.  Apply the CHG Soap to your body ONLY FROM THE NECK DOWN.   Do not use on face/ open                           Wound or open sores. Avoid contact with eyes, ears mouth and genitals (private parts).                       Wash face,  Genitals (private parts) with your normal soap.             6.  Wash thoroughly, paying special attention to the area where your surgery  will be performed.  7.  Thoroughly rinse your body with warm water from the neck down.  8.  DO NOT shower/wash with your normal soap after using and rinsing off  the CHG Soap.             9.  Pat yourself dry with a clean towel.            10.  Wear clean pajamas.            11.  Place clean sheets on your bed the night of your first shower and do not  sleep with pets. Day of Surgery : Do not apply any lotions/deodorants the morning of surgery.  Please wear clean clothes to the hospital/surgery center.  FAILURE TO FOLLOW THESE INSTRUCTIONS MAY RESULT IN THE CANCELLATION OF YOUR SURGERY   ________________________________________________________________________

## 2022-11-02 ENCOUNTER — Encounter (HOSPITAL_COMMUNITY)
Admission: RE | Admit: 2022-11-02 | Discharge: 2022-11-02 | Disposition: A | Discharge status: Home / Self Care | Payer: Medicare PPO | Source: Ambulatory Visit | Attending: Anesthesiology | Admitting: Anesthesiology

## 2022-11-02 DIAGNOSIS — I10 Essential (primary) hypertension: Secondary | ICD-10-CM

## 2022-11-02 NOTE — Progress Notes (Signed)
I called Pt about PAT visit. She is putting off surgery until he husbands health is better.

## 2022-11-16 ENCOUNTER — Ambulatory Visit: Admit: 2022-11-16 | Payer: Medicare PPO | Admitting: Urology

## 2022-11-16 DIAGNOSIS — Z01818 Encounter for other preprocedural examination: Secondary | ICD-10-CM

## 2022-11-16 SURGERY — SACROCOLPOPEXY, ROBOT-ASSISTED, LAPAROSCOPIC
Anesthesia: General

## 2023-04-27 ENCOUNTER — Other Ambulatory Visit: Payer: Self-pay | Admitting: Hematology & Oncology

## 2023-04-27 DIAGNOSIS — Z1231 Encounter for screening mammogram for malignant neoplasm of breast: Secondary | ICD-10-CM

## 2023-06-04 ENCOUNTER — Ambulatory Visit: Payer: Medicare PPO

## 2023-06-21 ENCOUNTER — Ambulatory Visit: Payer: Medicare PPO

## 2023-06-30 ENCOUNTER — Ambulatory Visit: Payer: Medicare PPO

## 2023-07-02 ENCOUNTER — Ambulatory Visit
Admission: RE | Admit: 2023-07-02 | Discharge: 2023-07-02 | Disposition: A | Discharge status: Home / Self Care | Payer: Medicare PPO | Source: Ambulatory Visit | Attending: Hematology & Oncology | Admitting: Hematology & Oncology

## 2023-07-02 DIAGNOSIS — Z1231 Encounter for screening mammogram for malignant neoplasm of breast: Secondary | ICD-10-CM

## 2023-07-02 HISTORY — DX: Malignant neoplasm of unspecified site of unspecified female breast: C50.919

## 2024-07-31 ENCOUNTER — Other Ambulatory Visit: Payer: Self-pay | Admitting: Hematology & Oncology

## 2024-07-31 DIAGNOSIS — Z1231 Encounter for screening mammogram for malignant neoplasm of breast: Secondary | ICD-10-CM

## 2024-08-08 ENCOUNTER — Ambulatory Visit
Admission: RE | Admit: 2024-08-08 | Discharge: 2024-08-08 | Disposition: A | Discharge status: Home / Self Care | Payer: Medicare PPO | Source: Ambulatory Visit | Attending: Nurse Practitioner | Admitting: Nurse Practitioner

## 2024-08-08 ENCOUNTER — Other Ambulatory Visit: Payer: Self-pay | Admitting: Hematology & Oncology

## 2024-08-08 DIAGNOSIS — Z1231 Encounter for screening mammogram for malignant neoplasm of breast: Secondary | ICD-10-CM
